# Patient Record
Sex: Female | Born: 1995 | Race: Black or African American | Hispanic: No | Marital: Single | Smoking: Never smoker
Health system: Southern US, Community
[De-identification: ages and names within clinical notes are randomized; demographics above are authoritative.]

## PROBLEM LIST (undated history)

## (undated) DIAGNOSIS — I10 Essential (primary) hypertension: Secondary | ICD-10-CM

## (undated) DIAGNOSIS — T7840XA Allergy, unspecified, initial encounter: Secondary | ICD-10-CM

## (undated) DIAGNOSIS — J449 Chronic obstructive pulmonary disease, unspecified: Secondary | ICD-10-CM

## (undated) DIAGNOSIS — E119 Type 2 diabetes mellitus without complications: Secondary | ICD-10-CM

## (undated) DIAGNOSIS — D571 Sickle-cell disease without crisis: Secondary | ICD-10-CM

## (undated) HISTORY — PX: CHOLECYSTECTOMY: SHX55

## (undated) HISTORY — DX: Allergy, unspecified, initial encounter: T78.40XA

## (undated) HISTORY — PX: MOUTH SURGERY: SHX715

---
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
## 2013-01-02 ENCOUNTER — Encounter (HOSPITAL_COMMUNITY): Payer: Self-pay | Admitting: Emergency Medicine

## 2013-01-02 ENCOUNTER — Emergency Department (HOSPITAL_COMMUNITY): Payer: Medicaid Other

## 2013-01-02 ENCOUNTER — Emergency Department (HOSPITAL_COMMUNITY)
Admission: EM | Admit: 2013-01-02 | Discharge: 2013-01-02 | Disposition: A | Discharge status: Home / Self Care | Payer: Medicaid Other | Attending: Emergency Medicine | Admitting: Emergency Medicine

## 2013-01-02 DIAGNOSIS — M545 Low back pain, unspecified: Secondary | ICD-10-CM | POA: Insufficient documentation

## 2013-01-02 DIAGNOSIS — D5700 Hb-SS disease with crisis, unspecified: Secondary | ICD-10-CM | POA: Insufficient documentation

## 2013-01-02 DIAGNOSIS — Z3202 Encounter for pregnancy test, result negative: Secondary | ICD-10-CM | POA: Insufficient documentation

## 2013-01-02 HISTORY — DX: Sickle-cell disease without crisis: D57.1

## 2013-01-02 LAB — CBC WITH DIFFERENTIAL/PLATELET
Basophils Relative: 0 % (ref 0–1)
Eosinophils Absolute: 0.1 10*3/uL (ref 0.0–0.7)
Eosinophils Relative: 1 % (ref 0–5)
HCT: 24.6 % — ABNORMAL LOW (ref 36.0–46.0)
Hemoglobin: 7.9 g/dL — ABNORMAL LOW (ref 12.0–15.0)
Lymphocytes Relative: 12 % (ref 12–46)
MCHC: 32.1 g/dL (ref 30.0–36.0)
Monocytes Relative: 9 % (ref 3–12)
Neutro Abs: 5.4 10*3/uL (ref 1.7–7.7)
Neutrophils Relative %: 78 % — ABNORMAL HIGH (ref 43–77)
RBC: 3.71 MIL/uL — ABNORMAL LOW (ref 3.87–5.11)

## 2013-01-02 LAB — BASIC METABOLIC PANEL
BUN: 9 mg/dL (ref 6–23)
CO2: 23 mEq/L (ref 19–32)
Chloride: 106 mEq/L (ref 96–112)
GFR calc non Af Amer: >90 mL/min (ref >90)
Glucose, Bld: 92 mg/dL (ref 70–99)
Potassium: 3.3 mEq/L — ABNORMAL LOW (ref 3.5–5.1)
Sodium: 138 mEq/L (ref 135–145)

## 2013-01-02 LAB — URINALYSIS, ROUTINE W REFLEX MICROSCOPIC
Bilirubin Urine: NEGATIVE
Hgb urine dipstick: NEGATIVE
Specific Gravity, Urine: 1.018 (ref 1.005–1.030)
Urobilinogen, UA: 2 mg/dL — ABNORMAL HIGH (ref 0.0–1.0)
pH: 6 (ref 5.0–8.0)

## 2013-01-02 LAB — RETICULOCYTES
RBC.: 3.71 MIL/uL — ABNORMAL LOW (ref 3.87–5.11)
Retic Count, Absolute: 315.4 10*3/uL — ABNORMAL HIGH (ref 19.0–186.0)
Retic Ct Pct: 8.5 % — ABNORMAL HIGH (ref 0.4–3.1)

## 2013-01-02 MED ORDER — HYDROCODONE-ACETAMINOPHEN 5-325 MG PO TABS: 1.0000 | ORAL_TABLET | Freq: Four times a day (QID) | ORAL | Status: DC | PRN

## 2013-01-02 NOTE — ED Provider Notes (Addendum)
History     CSN: [id number]  Arrival date & time 01/02/13  0865   First MD Initiated Contact with Patient 01/02/13 1916      Chief Complaint  Patient presents with  . Sickle Cell Pain Crisis  . Back Pain    (Consider location/radiation/quality/duration/timing/severity/associated sxs/prior treatment) Patient is a 17 y.o. female presenting with sickle cell pain and back pain. The history is provided by the patient.  Sickle Cell Pain Crisis  This is a recurrent problem. The current episode started today (30 min ago). The onset was sudden. The problem occurs continuously. The problem has been rapidly improving. The pain is associated with an unknown factor. The pain is present in the midline region. Site of pain is localized in bone and muscle. The pain is similar to prior episodes. Pain severity now: pain is mild after meds by EMS. Nothing relieves the symptoms. The symptoms are not relieved by ibuprofen. The symptoms are aggravated by activity and movement. Associated symptoms include back pain. Pertinent negatives include no chest pain, no abdominal pain, no nausea, no vomiting, no dysuria, no vaginal bleeding, no vaginal discharge, no joint pain, no neck pain, no neck stiffness, no loss of sensation, no tingling, no weakness, no cough and no difficulty breathing. There is no swelling present. She has been eating and drinking normally. Urine output has been normal. The last void occurred less than 6 hours ago. She sickle cell type is SS. There is no history of acute chest syndrome. There have been no frequent pain crises. She has been treated with chronic transfusion therapy (last blood transfusion was 2 years ago). Recently, medical care has been given by EMS. Services received include medications given ( of fentanyl).  Back Pain Associated symptoms: no abdominal pain, no chest pain, no dysuria, no tingling and no weakness     Past Medical History  Diagnosis Date  . Sickle cell anemia       No past surgical history on file.  History reviewed. No pertinent family history.  History  Substance Use Topics  . Smoking status: Not on file  . Smokeless tobacco: Not on file  . Alcohol Use: No    OB History   Grav Para Term Preterm Abortions TAB SAB Ect Mult Living                  Review of Systems  HENT: Negative for neck pain.   Respiratory: Negative for cough.   Cardiovascular: Negative for chest pain.  Gastrointestinal: Negative for nausea, vomiting and abdominal pain.  Genitourinary: Negative for dysuria, vaginal bleeding and vaginal discharge.  Musculoskeletal: Positive for back pain. Negative for joint pain.  Neurological: Negative for tingling and weakness.  All other systems reviewed and are negative.    Allergies  Vancomycin  Home Medications  No current outpatient prescriptions on file.  BP 126/86  Pulse 84  Temp(Src) 99 F (37.2 C) (Oral)  Resp 20  SpO2 100%  LMP 12/30/2012  Physical Exam  Nursing note and vitals reviewed. Constitutional: She is oriented to person, place, and time. She appears well-developed and well-nourished. No distress.  HENT:  Head: Normocephalic and atraumatic.  Mouth/Throat: Oropharynx is clear and moist.  Eyes: Conjunctivae and EOM are normal. Pupils are equal, round, and reactive to light.  Neck: Normal range of motion. Neck supple.  Cardiovascular: Normal rate, regular rhythm and intact distal pulses.   No murmur heard. Pulmonary/Chest: Effort normal and breath sounds normal. No respiratory distress. She has no  wheezes. She has no rales.  Abdominal: Soft. She exhibits no distension. There is no tenderness. There is no rebound and no guarding.  Musculoskeletal: Normal range of motion. She exhibits no edema and no tenderness.       Lumbar back: She exhibits tenderness, bony tenderness and pain. She exhibits normal range of motion, no deformity and normal pulse.       Back:  Neurological: She is alert and  oriented to person, place, and time. She has normal strength. No sensory deficit.  Normal strength and sensation in the legs  Skin: Skin is warm and dry. No rash noted. No erythema.  Psychiatric: She has a normal mood and affect. Her behavior is normal.    ED Course  Procedures (including critical care time)  Labs Reviewed  CBC WITH DIFFERENTIAL - Abnormal; Notable for the following:    RBC 3.71 (*)    Hemoglobin 7.9 (*)    HCT 24.6 (*)    MCV 66.3 (*)    MCH 21.3 (*)    RDW 21.5 (*)    Neutrophils Relative 78 (*)    All other components within normal limits  BASIC METABOLIC PANEL - Abnormal; Notable for the following:    Potassium 3.3 (*)    All other components within normal limits  URINALYSIS, ROUTINE W REFLEX MICROSCOPIC - Abnormal; Notable for the following:    APPearance CLOUDY (*)    Urobilinogen, UA 2.0 (*)    All other components within normal limits  RETICULOCYTES - Abnormal; Notable for the following:    Retic Ct Pct 8.5 (*)    RBC. 3.71 (*)    Retic Count, Manual 315.4 (*)    All other components within normal limits  POCT PREGNANCY, URINE   Dg Lumbar Spine Complete  01/02/2013  *RADIOLOGY REPORT*  Clinical Data: Low back pain for 30 minutes.  LUMBAR SPINE - COMPLETE 4+ VIEW  Comparison: None.  Findings: Four view exam shows no fracture.  No subluxation. Intervertebral disc spaces are preserved.  The facets are well- aligned bilaterally.  SI joints are normal.  IMPRESSION: Normal exam.   Original Report Authenticated By: Kennith Center, M.D.      1. Sickle cell pain crisis   2. Lumbar back pain       MDM   Patient with a history of sickle cell disease who comes in today complaining of lumbar back pain that started approximately 30 minutes prior to arrival. She states she was laying in bed when the pain started. She denies any heavy lifting, radiation down to her legs, fever or, recent hospitalization for recent illness.  Patient was given fentanyl per EMS on the  way here and when she arrived here her pain was 2/10. She states she's had pain in her back before approximately 3-4 months ago but usually just takes ibuprofen and Benadryl. Her last blood transfusion was 2 years ago and she states she does not usually get pain crises. Most recent menses was last week and she denies any urinary symptoms or vaginal discharge. She has mild L-spine tenderness on exam but otherwise her exam is normal.  Lumbar films are within normal limits. CBC, BMP, UA, UPT, retic count pending  10:01 PM Labs with a hemoglobin of 7.9 and elevation of the reticular. All spine films are negative. Feel this is a sickle cell crisis his urine and urine pregnancy are negative. Patient remains pain free after one dose of IV fentanyl. Will discharge home with pain medication and  followup with Dr. Nedra Hai, MD 01/02/13 5621  Gwyneth Sprout, MD 01/02/13 2204

## 2013-01-02 NOTE — ED Notes (Signed)
Pt here via ems for c/o Sickle Cell pain hurting in her back mostly.Given Fentanyl in route iv. Pain now 5/10

## 2013-01-02 NOTE — ED Notes (Signed)
Asked patient if able to provide a urine sample. Pt stated she is unable to provide at this time as she voided prior to arrival. Pt understanding of need for sample. Will call when able to give sample for lab.

## 2013-01-02 NOTE — ED Notes (Signed)
ZOX:[id number]<AV> Expected date:<BR> Expected time:<BR> Means of arrival:<BR> Comments:<BR> Ems/back

## 2013-03-05 ENCOUNTER — Observation Stay (HOSPITAL_COMMUNITY): Payer: Medicaid Other

## 2013-03-05 ENCOUNTER — Emergency Department (HOSPITAL_COMMUNITY): Payer: Medicaid Other

## 2013-03-05 ENCOUNTER — Encounter (HOSPITAL_COMMUNITY): Payer: Self-pay

## 2013-03-05 ENCOUNTER — Observation Stay (HOSPITAL_COMMUNITY)
Admission: EM | Admit: 2013-03-05 | Discharge: 2013-03-08 | Disposition: A | Discharge status: Home / Self Care | Payer: Medicaid Other | Attending: Internal Medicine | Admitting: Internal Medicine

## 2013-03-05 DIAGNOSIS — E876 Hypokalemia: Secondary | ICD-10-CM | POA: Insufficient documentation

## 2013-03-05 DIAGNOSIS — D649 Anemia, unspecified: Secondary | ICD-10-CM | POA: Insufficient documentation

## 2013-03-05 DIAGNOSIS — Z9089 Acquired absence of other organs: Secondary | ICD-10-CM | POA: Insufficient documentation

## 2013-03-05 DIAGNOSIS — D696 Thrombocytopenia, unspecified: Secondary | ICD-10-CM | POA: Insufficient documentation

## 2013-03-05 DIAGNOSIS — D72829 Elevated white blood cell count, unspecified: Secondary | ICD-10-CM | POA: Insufficient documentation

## 2013-03-05 DIAGNOSIS — D5700 Hb-SS disease with crisis, unspecified: Principal | ICD-10-CM | POA: Insufficient documentation

## 2013-03-05 DIAGNOSIS — IMO0002 Reserved for concepts with insufficient information to code with codable children: Secondary | ICD-10-CM | POA: Insufficient documentation

## 2013-03-05 DIAGNOSIS — D735 Infarction of spleen: Secondary | ICD-10-CM | POA: Diagnosis present

## 2013-03-05 DIAGNOSIS — D7389 Other diseases of spleen: Secondary | ICD-10-CM | POA: Insufficient documentation

## 2013-03-05 LAB — CBC WITH DIFFERENTIAL/PLATELET
Basophils Absolute: 0 10*3/uL (ref 0.0–0.1)
Basophils Relative: 0 % (ref 0–1)
Eosinophils Absolute: 0 10*3/uL (ref 0.0–0.7)
Eosinophils Relative: 0 % (ref 0–5)
HCT: 21.6 % — ABNORMAL LOW (ref 36.0–46.0)
Hemoglobin: 7.1 g/dL — ABNORMAL LOW (ref 12.0–15.0)
Lymphocytes Relative: 6 % — ABNORMAL LOW (ref 12–46)
Lymphs Abs: 0.7 10*3/uL (ref 0.7–4.0)
MCH: 21.2 pg — ABNORMAL LOW (ref 26.0–34.0)
MCHC: 32.9 g/dL (ref 30.0–36.0)
MCV: 64.5 fL — ABNORMAL LOW (ref 78.0–100.0)
Monocytes Absolute: 0.6 10*3/uL (ref 0.1–1.0)
Monocytes Relative: 5 % (ref 3–12)
Neutro Abs: 10.8 10*3/uL — ABNORMAL HIGH (ref 1.7–7.7)
Neutrophils Relative %: 89 % — ABNORMAL HIGH (ref 43–77)
Platelets: 90 10*3/uL — ABNORMAL LOW (ref 150–400)
RBC: 3.35 MIL/uL — ABNORMAL LOW (ref 3.87–5.11)
RDW: 21.7 % — ABNORMAL HIGH (ref 11.5–15.5)
WBC: 12.1 10*3/uL — ABNORMAL HIGH (ref 4.0–10.5)

## 2013-03-05 LAB — PREGNANCY, URINE: Preg Test, Ur: NEGATIVE

## 2013-03-05 LAB — URINALYSIS, MICROSCOPIC ONLY
Nitrite: NEGATIVE
Specific Gravity, Urine: 1.017 (ref 1.005–1.030)
pH: 6.5 (ref 5.0–8.0)

## 2013-03-05 LAB — COMPREHENSIVE METABOLIC PANEL
ALT: 21 U/L (ref 0–35)
BUN: 10 mg/dL (ref 6–23)
Calcium: 10.3 mg/dL (ref 8.4–10.5)
GFR calc Af Amer: >90 mL/min (ref >90)
Glucose, Bld: 89 mg/dL (ref 70–99)
Sodium: 137 mEq/L (ref 135–145)
Total Protein: 7.5 g/dL (ref 6.0–8.3)

## 2013-03-05 LAB — MAGNESIUM: Magnesium: 1.7 mg/dL (ref 1.5–2.5)

## 2013-03-05 MED ORDER — SODIUM CHLORIDE 0.45 % IV SOLN
INTRAVENOUS | Status: DC
  Administered 2013-03-05 – 2013-03-07 (×4): via INTRAVENOUS

## 2013-03-05 MED ORDER — ACETAMINOPHEN 325 MG PO TABS
650.0000 mg | ORAL_TABLET | Freq: Four times a day (QID) | ORAL | Status: DC | PRN
  Administered 2013-03-06: 650 mg via ORAL
  Filled 2013-03-05: qty 2

## 2013-03-05 MED ORDER — HYDROMORPHONE HCL PF 1 MG/ML IJ SOLN
1.0000 mg | Freq: Once | INTRAMUSCULAR | Status: AC
  Administered 2013-03-05: 1 mg via INTRAVENOUS
  Filled 2013-03-05: qty 1

## 2013-03-05 MED ORDER — FOLIC ACID 1 MG PO TABS
1.0000 mg | ORAL_TABLET | Freq: Every day | ORAL | Status: DC
  Administered 2013-03-06: 1 mg via ORAL
  Filled 2013-03-05 (×4): qty 1

## 2013-03-05 MED ORDER — ACETAMINOPHEN 650 MG RE SUPP: 650.0000 mg | Freq: Four times a day (QID) | RECTAL | Status: DC | PRN

## 2013-03-05 MED ORDER — OXYCODONE HCL 5 MG PO TABS: 10.0000 mg | ORAL_TABLET | ORAL | Status: DC | PRN

## 2013-03-05 MED ORDER — SODIUM CHLORIDE 0.9 % IV BOLUS (SEPSIS)
1000.0000 mL | Freq: Once | INTRAVENOUS | Status: AC
  Administered 2013-03-05: 1000 mL via INTRAVENOUS

## 2013-03-05 MED ORDER — POTASSIUM CHLORIDE CRYS ER 20 MEQ PO TBCR
40.0000 meq | EXTENDED_RELEASE_TABLET | Freq: Two times a day (BID) | ORAL | Status: AC
  Administered 2013-03-05 – 2013-03-06 (×2): 40 meq via ORAL
  Filled 2013-03-05 (×2): qty 2

## 2013-03-05 MED ORDER — ONDANSETRON HCL 4 MG PO TABS: 4.0000 mg | ORAL_TABLET | Freq: Four times a day (QID) | ORAL | Status: DC | PRN

## 2013-03-05 MED ORDER — ONDANSETRON HCL 4 MG/2ML IJ SOLN: 4.0000 mg | Freq: Four times a day (QID) | INTRAMUSCULAR | Status: DC | PRN

## 2013-03-05 MED ORDER — MORPHINE SULFATE 2 MG/ML IJ SOLN
1.0000 mg | INTRAMUSCULAR | Status: DC | PRN
  Administered 2013-03-05 – 2013-03-06 (×3): 1 mg via INTRAVENOUS
  Filled 2013-03-05 (×3): qty 1

## 2013-03-05 MED ORDER — SENNOSIDES-DOCUSATE SODIUM 8.6-50 MG PO TABS
1.0000 | ORAL_TABLET | Freq: Every day | ORAL | Status: DC | PRN
  Filled 2013-03-05: qty 1

## 2013-03-05 MED ORDER — ENOXAPARIN SODIUM 40 MG/0.4ML ~~LOC~~ SOLN
40.0000 mg | SUBCUTANEOUS | Status: DC
Start: 2013-03-05 — End: 2013-03-07
  Filled 2013-03-05 (×3): qty 0.4

## 2013-03-05 NOTE — H&P (Addendum)
[facility] History and Physical  Ana Brooks ZOX:[id number] DOB: 09-03-1996 DOA: 03/05/2013  Referring physician: Dr Ranae Palms PCP: August Saucer ERIC, MD    Chief Complaint: left sided abdominal pain.   HPI: Ana Brooks is a 17 y.o. female with h/o sickle cell anemia, on ibuprofen at home, s/o cholecystectomy, came in for left sided abdominal pain, not associated with nausea, vomiting. On arrival to ED, she was found to have hgb of 7.1 drop from baseline of 7.8, hypokalemic and with mild leukocytosis. [place] abd revealed diffuse intrahepatic biliary prominence without CBD dilatation. And mild splenomegaly. Her symptoms improved after IV pain medication and fluids . But she is scared to go home for recurrence of pain. We will admit her for observation and manage her pain with fluids, anti emetics and paincontrol.   Review of Systems: The patient denies anorexia, fever, weight loss,, vision loss, decreased hearing, hoarseness, chest pain, syncope, dyspnea on exertion, peripheral edema, balance deficits, hemoptysis,  melena, hematochezia, severe indigestion/heartburn, hematuria, incontinence, genital sores, muscle weakness, suspicious skin lesions, transient blindness, difficulty walking, depression, unusual weight change, abnormal bleeding, enlarged lymph nodes, angioedema, and breast masses.    Past Medical History  Diagnosis Date  . Sickle cell anemia    Past Surgical History  Procedure Laterality Date  . Mouth surgery     Social History:  reports that she has never smoked. She does not have any smokeless tobacco history on file. She reports that she does not drink alcohol or use illicit drugs.  where does patient live--home,   Allergies  Allergen Reactions  . Vancomycin Hives    No family history on file.   Prior to Admission medications   Not on File   Physical Exam: Filed Vitals:   03/05/13 1003  BP: 127/61  Pulse: 94  Temp: 98.3 F (36.8 C)  TempSrc: Oral   Resp: 18  SpO2: 100%    Constitutional: Vital signs reviewed.  Patient is a well-developed and well-nourished  in no acute distress and cooperative with exam. Alert and oriented x3.  Head: Normocephalic and atraumatic Mouth: no erythema or exudates, MMM Eyes: PERRL, EOMI, conjunctivae normal, No scleral icterus.  Neck: Supple, Trachea midline normal ROM, No JVD, mass, thyromegaly, or carotid bruit present.  Cardiovascular: RRR, S1 normal, S2 normal, no MRG, pulses symmetric and intact bilaterally Pulmonary/Chest: CTAB, no wheezes, rales, or rhonchi Abdominal: Soft. Tender in the left upper quadrant, non-distended, bowel sounds are normal, voluntary quarding present and no signs of peritonitis.  Musculoskeletal: No joint deformities, erythema, or stiffness, ROM full and no nontender Neurological: A&O x3, Strength is normal and symmetric bilaterally, cranial nerve II-XII are grossly intact, no focal motor deficit, sensory intact to light touch bilaterally.  Skin: Warm, dry and intact. No rash, cyanosis, or clubbing.  Psychiatric: Normal mood and affect. speech and behavior is normal.  Labs on Admission:  Basic Metabolic Panel:  Recent Labs Lab 03/05/13 1033  NA 137  K 3.3*  CL 104  CO2 21  GLUCOSE 89  BUN 10  CREATININE 0.62  CALCIUM 10.3   Liver Function Tests:  Recent Labs Lab 03/05/13 1033  AST 63*  ALT 21  ALKPHOS 48  BILITOT 3.7*  PROT 7.5  ALBUMIN 4.7    Recent Labs Lab 03/05/13 1033  LIPASE 18   No results found for this basename: AMMONIA,  in the last 168 hours CBC:  Recent Labs Lab 03/05/13 1033  WBC 12.1*  NEUTROABS 10.8*  HGB 7.1*  HCT 21.6*  MCV 64.5*  PLT 90*   Cardiac Enzymes: No results found for this basename: CKTOTAL, CKMB, CKMBINDEX, TROPONINI,  in the last 168 hours  BNP (last 3 results) No results found for this basename: PROBNP,  in the last 8760 hours CBG: No results found for this basename: GLUCAP,  in the last 168  hours  Radiological Exams on Admission: US Abdomen Complete  03/05/2013  *RADIOLOGY REPORT*  Clinical Data:  Left upper quadrant pain, six cell anemia, history of cholecystectomy  ABDOMINAL ULTRASOUND COMPLETE  Comparison:  None.  Findings:  Gallbladder:  Surgically absent  Common Bile Duct:  Within normal limits in caliber.  Liver: No focal mass lesion identified.  Within normal limits in parenchymal echogenicity.Mild diffuse intrahepatic biliary prominence without CBD dilatation.  This is nonspecific but can be seen after cholecystectomy.  Recommend correlation with LFTs and bilirubin.  IVC:  Appears normal.  Pancreas:  No abnormality identified.  Spleen:  Homogeneous echotexture but slightly enlarged.  13 cm length.  No focal abnormality.  Right kidney:  Normal in size and parenchymal echogenicity.  No evidence of mass or hydronephrosis.  Left kidney:  Normal in size and parenchymal echogenicity.  No evidence of mass or hydronephrosis.  Abdominal Aorta:  No aneurysm identified.  IMPRESSION: Diffuse intrahepatic biliary prominence.  No definite biliary obstruction. Normalcommon bile duct diameter measuring 4.6 mm.  Prior cholecystectomy.  Mild splenomegaly.  No acute finding by ultrasound.   Original Report Authenticated By: Judie Petit. Shick, M.D.     EKG: pending.  Assessment/Plan Active Problems:   Sickle cell crisis   Anemia   Leukocytosis   Hypokalemia   1. Abdominal pain possibly from Sickle cell Painful crisis/ splenic sequestration:  Admit for observation - IV fluids, IV pain control with morphine and oxy IR. And anti emetics.   2. Hypokalemia  - will be repleted as needed.  - will obtain mag levels.   3. Leukocytosis:  - possibly reactive, vs nucleated rbc's.  - UA negative. CXR pending. [place] abd shows mild splenomegaly.   4.anemia: microcytic. Anemia panel will be ordered. Folic acid ordered.    DVT prophylaxis.  Sickle cell service will take over the care of the patient in  am. Discussed with Dr Joneen Roach.  Code Status: presumed full code Family Communication: none atbedside Disposition Plan: possibly home when pain is controlled.   Time spent: 70 minutes.   [facility] Pager [phone]  If 7PM-7AM, please contact night-coverage [email or web address] Password [email or web address] 03/05/2013, 4:11 PM

## 2013-03-05 NOTE — ED Notes (Signed)
Patient in Ultrasound.

## 2013-03-05 NOTE — ED Notes (Signed)
Pt states she has RUQ pain.  Denies n/v/d.  Pt states pain started last night.  Describes pain as sharp

## 2013-03-05 NOTE — ED Provider Notes (Signed)
History     CSN: [id number]  Arrival date & time 03/05/13  4696   First MD Initiated Contact with Patient 03/05/13 1020      Chief Complaint  Patient presents with  . Abdominal Pain    (Consider location/radiation/quality/duration/timing/severity/associated sxs/prior treatment) HPI Pt is an 17yo female with hx of sickle cell c/o LUQ pain that is constant and sharp in nature 10/10 that started last night. Pain radiates to left flank.  Nothing makes the pain better or worse.  Pt usually takes hydrocodone for pain but has not had any since pain started suddenly last night.  Pt reports feeling well up until pain started. Denies fever, n/v/d, dysuria or vaginal discharge.  LMP 3/13. Was normal.  PCP Dr. August Saucer, routine appointment was scheduled for tomorrow 5/5.   Past Medical History  Diagnosis Date  . Sickle cell anemia     Past Surgical History  Procedure Laterality Date  . Mouth surgery      No family history on file.  History  Substance Use Topics  . Smoking status: Never Smoker   . Smokeless tobacco: Not on file  . Alcohol Use: No    OB History   Grav Para Term Preterm Abortions TAB SAB Ect Mult Living                  Review of Systems  Constitutional: Negative for fever and chills.  Respiratory: Negative for cough.   Cardiovascular: Negative for chest pain.  Gastrointestinal: Positive for abdominal pain.  Genitourinary: Negative for dysuria.  All other systems reviewed and are negative.    Allergies  Vancomycin  Home Medications  No current outpatient prescriptions on file.  BP 127/61  Pulse 94  Temp(Src) 98.3 F (36.8 C) (Oral)  Resp 18  SpO2 100%  Physical Exam  Nursing note and vitals reviewed. Constitutional: She appears well-developed and well-nourished. She appears distressed ( mild).  Pt lying on exam bed with hands over LUQ  HENT:  Head: Normocephalic and atraumatic.  Eyes: Conjunctivae are normal. No scleral icterus.  Neck: Normal  range of motion.  Cardiovascular: Normal rate, regular rhythm and normal heart sounds.   Pulmonary/Chest: Effort normal and breath sounds normal. No respiratory distress. She has no wheezes. She has no rales. She exhibits no tenderness.  Abdominal: Soft. Bowel sounds are normal. She exhibits no distension and no mass. There is tenderness ( diffuse, greater in LUQ). There is guarding ( LUQ). There is no rebound.  Musculoskeletal: Normal range of motion.  Neurological: She is alert.  Skin: Skin is warm and dry. She is not diaphoretic.    ED Course  Procedures (including critical care time)  Labs Reviewed  CBC WITH DIFFERENTIAL - Abnormal; Notable for the following:    WBC 12.1 (*)    RBC 3.35 (*)    Hemoglobin 7.1 (*)    HCT 21.6 (*)    MCV 64.5 (*)    MCH 21.2 (*)    RDW 21.7 (*)    Platelets 90 (*)    Neutrophils Relative 89 (*)    Lymphocytes Relative 6 (*)    Neutro Abs 10.8 (*)    All other components within normal limits  COMPREHENSIVE METABOLIC PANEL - Abnormal; Notable for the following:    Potassium 3.3 (*)    AST 63 (*)    Total Bilirubin 3.7 (*)    All other components within normal limits  URINALYSIS, MICROSCOPIC ONLY - Abnormal; Notable for the following:  APPearance CLOUDY (*)    Ketones, ur 40 (*)    Urobilinogen, UA 2.0 (*)    Leukocytes, UA SMALL (*)    Squamous Epithelial / LPF FEW (*)    All other components within normal limits  RETICULOCYTES - Abnormal; Notable for the following:    Retic Ct Pct 11.5 (*)    RBC. 3.37 (*)    Retic Count, Manual 387.6 (*)    All other components within normal limits  LIPASE, BLOOD  PREGNANCY, URINE   US Abdomen Complete  03/05/2013  *RADIOLOGY REPORT*  Clinical Data:  Left upper quadrant pain, six cell anemia, history of cholecystectomy  ABDOMINAL ULTRASOUND COMPLETE  Comparison:  None.  Findings:  Gallbladder:  Surgically absent  Common Bile Duct:  Within normal limits in caliber.  Liver: No focal mass lesion  identified.  Within normal limits in parenchymal echogenicity.Mild diffuse intrahepatic biliary prominence without CBD dilatation.  This is nonspecific but can be seen after cholecystectomy.  Recommend correlation with LFTs and bilirubin.  IVC:  Appears normal.  Pancreas:  No abnormality identified.  Spleen:  Homogeneous echotexture but slightly enlarged.  13 cm length.  No focal abnormality.  Right kidney:  Normal in size and parenchymal echogenicity.  No evidence of mass or hydronephrosis.  Left kidney:  Normal in size and parenchymal echogenicity.  No evidence of mass or hydronephrosis.  Abdominal Aorta:  No aneurysm identified.  IMPRESSION: Diffuse intrahepatic biliary prominence.  No definite biliary obstruction. Normalcommon bile duct diameter measuring 4.6 mm.  Prior cholecystectomy.  Mild splenomegaly.  No acute finding by ultrasound.   Original Report Authenticated By: Judie Petit. Shick, M.D.      1. Sickle cell crisis       MDM  Pt with hx of sickle cell presenting with LUQ pain.  Reports having spleenomegaly.  Last seen for crisis on 3/3 of this yr.  Was tx for pain and discharged home.  Pt states she does not normally get sickle cell crises.  Cannot remember last time she needed transfusion.  Does not know what her baseline Hgb is.   Started fluids and diluadid.  Will obtain cbc, bmp, abd u/s, and urine preg.  Pain improved with fluids and pain medicine.    Retic: 11.5% up from 8.5% two mo ago Hgb 7.1 down from 7.9 two mo ago. Hct 21.6 down from 24.6 two mo ago.  CMP: mild hypokalemia 3.3  Discussed with Dr. Ranae Palms who consulted Dr. Blake Divine who came to evaluate pt.  Would like to admit for observation.        Junius Finner, PA-C 03/05/13 1623

## 2013-03-06 ENCOUNTER — Encounter (HOSPITAL_COMMUNITY): Payer: Self-pay | Admitting: Radiology

## 2013-03-06 ENCOUNTER — Observation Stay (HOSPITAL_COMMUNITY): Payer: Medicaid Other

## 2013-03-06 DIAGNOSIS — D735 Infarction of spleen: Secondary | ICD-10-CM | POA: Diagnosis present

## 2013-03-06 LAB — IRON AND TIBC
Iron: 24 ug/dL — ABNORMAL LOW (ref 42–135)
TIBC: 227 ug/dL — ABNORMAL LOW (ref 250–470)
UIBC: 203 ug/dL (ref 125–400)

## 2013-03-06 LAB — FERRITIN: Ferritin: 456 ng/mL — ABNORMAL HIGH (ref 10–291)

## 2013-03-06 MED ORDER — IOHEXOL 300 MG/ML  SOLN
100.0000 mL | Freq: Once | INTRAMUSCULAR | Status: AC | PRN
  Administered 2013-03-06: 100 mL via INTRAVENOUS

## 2013-03-06 MED ORDER — IOHEXOL 300 MG/ML  SOLN
25.0000 mL | Freq: Once | INTRAMUSCULAR | Status: AC | PRN
  Administered 2013-03-06: 25 mL via ORAL

## 2013-03-06 MED ORDER — OXYCODONE HCL 5 MG PO TABS
10.0000 mg | ORAL_TABLET | ORAL | Status: DC
Start: 2013-03-06 — End: 2013-03-08
  Administered 2013-03-06 – 2013-03-08 (×9): 10 mg via ORAL
  Filled 2013-03-06 (×2): qty 2
  Filled 2013-03-06: qty 1
  Filled 2013-03-06 (×3): qty 2
  Filled 2013-03-06: qty 1
  Filled 2013-03-06 (×5): qty 2
  Filled 2013-03-06: qty 1

## 2013-03-06 MED ORDER — IOHEXOL 300 MG/ML  SOLN: 25.0000 mL | INTRAMUSCULAR | Status: DC

## 2013-03-06 MED ORDER — IBUPROFEN 400 MG PO TABS
400.0000 mg | ORAL_TABLET | Freq: Once | ORAL | Status: AC
  Administered 2013-03-06: 400 mg via ORAL
  Filled 2013-03-06: qty 1

## 2013-03-06 NOTE — Progress Notes (Signed)
Chaplain Note: Found pt sitting on side of bed. Introduced myself and told pt about services chaplains offer. Pt quiet and soft spoken...very little to say. Social, spiritual and emotional support offered.  Will follow up as needed.  Rutherford Nail Chaplain Resident [phone]

## 2013-03-06 NOTE — Care Management (Addendum)
Case Management Clinical Tool  Patient: Ana Brooks   MRN: [id number] Date Initiated: 03/06/2013            Documentation initiated by: Ana Caldwell RN, BSN, BS  Subjective/Objective Assessment:  17 year old female admitted for [place] pain crisis, for observation( hemiglobinopathy results pending). Patient states she has attempt to work however unable to get hired for a job, patient and Ana Brooks are concerned about patient getting disability. Patient states she was previously denied for disabliity. Patient had an appointment scheduled with Dr. Ashley Royalty at [facility] for today, which will be rescheduled to establish care.  Barriers to care: Will continue to monitor.  Prior Approval(PA) #: NA                                   PA start date:   NA                PA end date: NA   Action/Plan:  This CM called Ana Brooks (patient's case worker with [employer]) and awaiting call back to obtain status. (Received call from Ana Brooks CM with [facility], who advised that patient had not made any scheduled appointment with [facility] agency however CM did instruct patient on how to complete disability application, and no other action have been done by the agency at this time.)       Comments: Patient does not have any other concerns at this time. Contact information given to patient for this CM.This CM spoke with Ana Brooks CM for [facility] who stated   Ana Caldwell,  RN, BSN, [place] [phone]

## 2013-03-06 NOTE — Care Management (Signed)
CARE MANAGEMENT NOTE 03/06/2013  Patient:  Ana Brooks,Ana Brooks   Account Number:  [id number]  Date Initiated:  03/06/2013  Documentation initiated by:  Eulalio Reamy  Subjective/Objective Assessment:   17 yo female admitted with sickle cell crisis. PCP Dr.Matthews     Action/Plan:   Home when stable   Anticipated DC Date:     Anticipated DC Plan:  HOME/SELF CARE      DC Planning Services  CM consult      Choice offered to / List presented to:             Status of service:  In process, will continue to follow Medicare Important Message given?   (If response is "NO", the following Medicare IM given date fields will be blank) Date Medicare IM given:   Date Additional Medicare IM given:    Discharge Disposition:    Per UR Regulation:  Reviewed for med. necessity/level of care/duration of stay  If discussed at Long Length of Stay Meetings, dates discussed:    Comments:  03/06/13 1328 Jessey Huyett,RN,BSN [phone] No needs assessed at this time. [facility] Cm followiing for SSi benefits check & liason with [place] [facility]

## 2013-03-06 NOTE — Progress Notes (Signed)
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 Continue supportive care. Pt reports pain as mild. Will resume Hydrocodone and IV for breakthrough pain. Monitor Hb. Genotype HbSS verified by electrophoresis from [facility] records.    Anemia: Pt has had only a small drop in Hb. Will recheck Hb tomorrow. If stable she can likely go home.    Leukocytosis: Mild leukocytosis associated with VOC and splenic infarct.    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 Supportive care.   Code Status: Full code Family Communication: Updated Father Disposition Plan: Home in 24 to 48 hours as long as Hb stable.  MATTHEWS,MICHELLE A.  Pager [phone]. If 7PM-7AM, please contact night-coverage.  03/06/2013, 6:35 PM  LOS: 1 day   Brief narrative: Ana Brooks is a 17 y.o. female with h/o sickle cell anemia, on ibuprofen at home, s/o cholecystectomy, came in for left sided abdominal pain, not associated with nausea, vomiting. On arrival to ED, she was found to have hgb of 7.1 drop from baseline of 7.8, hypokalemic and with mild leukocytosis. [place] abd revealed diffuse intrahepatic biliary prominence without CBD dilatation. And mild splenomegaly. Her symptoms improved after IV pain medication and fluids . But she is scared to go home for recurrence of pain. We will admit her for observation and manage her pain with fluids, anti emetics and paincontrol.    Consultants:  None  Procedures:  None  Antibiotics:  None  HPI/Subjective: Pt states that pain is mild and localized only to her LUQ. Eating well. BM yesterday.  Objective: Filed Vitals:   03/06/13 0212 03/06/13 0551 03/06/13 1010 03/06/13 1357  BP: 124/53 123/54 125/59 126/67   Pulse: 103 94 97 96  Temp: 103 F (39.4 C) 101.2 F (38.4 C) 100.2 F (37.9 C) 98.9 F (37.2 C)  TempSrc: Oral Oral Oral Oral  Resp: 20 20 18 16   Height:      Weight:  79.3 kg (174 lb 13.2 oz)    SpO2: 98% 97% 98% 99%   Weight change:  No intake or output data in the 24 hours ending 03/06/13 1835  General: Alert, awake, oriented x3, in no acute distress.  HEENT: [place]/AT PEERL, EOMI Heart: Regular rate and rhythm, without murmurs, rubs, gallops.  Lungs: Clear to auscultation, no wheezing or rhonchi noted.  Abdomen: Soft,LUQ tenderness, nondistended, positive bowel sounds, no masses mild splenomegaly noted.  Neuro: No focal neurological deficits noted cranial nerves II through XII grossly intact. Strength functional in bilateral upper and lower extremities. Musculoskeletal: No warm swelling or erythema around joints, no spinal tenderness noted. Psychiatric: Patient alert and oriented x3, good insight and cognition, good recent to remote recall.   Data Reviewed: Basic Metabolic Panel:  Recent Labs Lab 03/05/13 1033 03/05/13 1959  NA 137  --   K 3.3*  --   CL 104  --   CO2 21  --   GLUCOSE 89  --   BUN 10  --   CREATININE 0.62  --   CALCIUM 10.3  --   MG  --  1.7   Liver Function Tests:  Recent Labs Lab 03/05/13 1033  AST 63*  ALT 21  ALKPHOS 48  BILITOT 3.7*  PROT 7.5  ALBUMIN 4.7    Recent Labs  Lab 03/05/13 1033  LIPASE 18   No results found for this basename: AMMONIA,  in the last 168 hours CBC:  Recent Labs Lab 03/05/13 1033  WBC 12.1*  NEUTROABS 10.8*  HGB 7.1*  HCT 21.6*  MCV 64.5*  PLT 90*   Cardiac Enzymes: No results found for this basename: CKTOTAL, CKMB, CKMBINDEX, TROPONINI,  in the last 168 hours BNP (last 3 results) No results found for this basename: PROBNP,  in the last 8760 hours CBG: No results found for this basename: GLUCAP,  in the last 168 hours  No results found for this or any previous visit (from the past 240  hour(s)).   Studies: Dg Chest 2 View  03/05/2013  *RADIOLOGY REPORT*  Clinical Data: Lower chest pain, sickle cell  CHEST - 2 VIEW  None  Findings: The cardiomediastinal silhouette is unremarkable.  No acute infiltrate or pleural effusion.  No pulmonary edema.  Bony thorax is unremarkable.  IMPRESSION: No active disease.   Original Report Authenticated By: Natasha Mead, M.D.    US Abdomen Complete  03/05/2013  *RADIOLOGY REPORT*  Clinical Data:  Left upper quadrant pain, six cell anemia, history of cholecystectomy  ABDOMINAL ULTRASOUND COMPLETE  Comparison:  None.  Findings:  Gallbladder:  Surgically absent  Common Bile Duct:  Within normal limits in caliber.  Liver: No focal mass lesion identified.  Within normal limits in parenchymal echogenicity.Mild diffuse intrahepatic biliary prominence without CBD dilatation.  This is nonspecific but can be seen after cholecystectomy.  Recommend correlation with LFTs and bilirubin.  IVC:  Appears normal.  Pancreas:  No abnormality identified.  Spleen:  Homogeneous echotexture but slightly enlarged.  13 cm length.  No focal abnormality.  Right kidney:  Normal in size and parenchymal echogenicity.  No evidence of mass or hydronephrosis.  Left kidney:  Normal in size and parenchymal echogenicity.  No evidence of mass or hydronephrosis.  Abdominal Aorta:  No aneurysm identified.  IMPRESSION: Diffuse intrahepatic biliary prominence.  No definite biliary obstruction. Normalcommon bile duct diameter measuring 4.6 mm.  Prior cholecystectomy.  Mild splenomegaly.  No acute finding by ultrasound.   Original Report Authenticated By: Judie Petit. Miles Costain, M.D.    Ct Abdomen Pelvis W Contrast  03/06/2013  *RADIOLOGY REPORT*  Clinical Data: Sickle cell crisis.  Leukocytosis.  Left lower quadrant pain and fever.  CT ABDOMEN AND PELVIS WITH CONTRAST  Technique:  Multidetector CT imaging of the abdomen and pelvis was performed following the standard protocol during bolus administration of intravenous  contrast.  Contrast: OMNIPAQUE IOHEXOL 300 MG/ML  SOLN  Comparison: None.  Findings: No focal abnormalities seen in the liver.  Liver size normal at 14.5 cm in cranial caudal length.  Spleen is enlarged at 16.6 cm in cranial caudal length.  No focal abnormalities seen in the spleen although there are subtle patchy areas of decreased subcapsular perfusion in the mid and upper spleen.  The stomach, duodenum, pancreas, and adrenal glands are unremarkable.  Gallbladder is surgically absent.  Right kidney is unremarkable.  The splenic enlargement generates mass effect on the left kidney.  No abdominal aortic aneurysm.  No free fluid or lymphadenopathy in the abdomen.  The abdominal bowel loops have normal imaging features.  No pelvic sidewall lymphadenopathy. Small amount of intraperitoneal free fluid is identified and can be physiologic in a premenopausal female.  Bladder is unremarkable.  Uterus is normal in appearance. No adnexal mass.  Terminal ileum is normal.  The appendix is normal.  Bone windows reveal  no worrisome lytic or sclerotic osseous lesions.  IMPRESSION: Splenomegaly with patchy subtle areas of decreased perfusion in the subcapsular spleen.  Areas of splenic infarction could have this appearance.  Small amount of ascites.   Original Report Authenticated By: Kennith Center, M.D.     Scheduled Meds: . enoxaparin (LOVENOX) injection  40 mg Subcutaneous Q24H  . folic acid  1 mg Oral Daily  . oxyCODONE  10 mg Oral Q4H   Continuous Infusions: . sodium chloride 125 mL/hr at 03/06/13 1115    Time spent 45 minutes.

## 2013-03-07 LAB — CBC WITH DIFFERENTIAL/PLATELET
Basophils Absolute: 0 10*3/uL (ref 0.0–0.1)
Basophils Relative: 0 % (ref 0–1)
Eosinophils Absolute: 0 10*3/uL (ref 0.0–0.7)
HCT: 20.9 % — ABNORMAL LOW (ref 36.0–46.0)
Hemoglobin: 6.5 g/dL — CL (ref 12.0–15.0)
Lymphocytes Relative: 18 % (ref 12–46)
Lymphs Abs: 1.7 10*3/uL (ref 0.7–4.0)
MCH: 20.4 pg — ABNORMAL LOW (ref 26.0–34.0)
MCHC: 31.1 g/dL (ref 30.0–36.0)
MCV: 65.7 fL — ABNORMAL LOW (ref 78.0–100.0)
Neutro Abs: 6.9 10*3/uL (ref 1.7–7.7)
RDW: 20.4 % — ABNORMAL HIGH (ref 11.5–15.5)

## 2013-03-07 LAB — COMPREHENSIVE METABOLIC PANEL
ALT: 12 U/L (ref 0–35)
AST: 23 U/L (ref 0–37)
Alkaline Phosphatase: 40 U/L (ref 39–117)
CO2: 23 mEq/L (ref 19–32)
Calcium: 9.3 mg/dL (ref 8.4–10.5)
GFR calc non Af Amer: >90 mL/min (ref >90)
Potassium: 3.2 mEq/L — ABNORMAL LOW (ref 3.5–5.1)
Sodium: 132 mEq/L — ABNORMAL LOW (ref 135–145)
Total Protein: 6.7 g/dL (ref 6.0–8.3)

## 2013-03-07 LAB — HEMOGLOBIN AND HEMATOCRIT, BLOOD
HCT: 21.1 % — ABNORMAL LOW (ref 36.0–46.0)
Hemoglobin: 6.7 g/dL — CL (ref 12.0–15.0)
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
## 2013-03-07 NOTE — ED Provider Notes (Signed)
Medical screening examination/treatment/procedure(s) were conducted as a shared visit with non-physician practitioner(s) and myself.  I personally evaluated the patient during the encounter   Loren Racer, MD 03/07/13 [phone]
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
## 2013-03-07 NOTE — Progress Notes (Signed)
CRITICAL VALUE ALERT  Critical value received:  Hemoglobin:6.5  Date of notification:  03/07/2013   Time of notification:  0930  Critical value read back:yes  Nurse who received alert:  Farley Ly  MD notified (1st page):  Dr. Ashley Royalty  Time of first page:  0930  MD notified (2nd page):  Time of second page:  Responding MD: Ashley Royalty  Time MD responded:
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
## 2013-03-08 LAB — COMPREHENSIVE METABOLIC PANEL
BUN: 3 mg/dL — ABNORMAL LOW (ref 6–23)
CO2: 25 mEq/L (ref 19–32)
Calcium: 9.9 mg/dL (ref 8.4–10.5)
Chloride: 104 mEq/L (ref 96–112)
Creatinine, Ser: 0.57 mg/dL (ref 0.50–1.10)
GFR calc non Af Amer: >90 mL/min (ref >90)
Total Bilirubin: 2.6 mg/dL — ABNORMAL HIGH (ref 0.3–1.2)

## 2013-03-08 LAB — CBC
Platelets: 100 10*3/uL — ABNORMAL LOW (ref 150–400)
RBC: 3.37 MIL/uL — ABNORMAL LOW (ref 3.87–5.11)
WBC: 6.8 10*3/uL (ref 4.0–10.5)

## 2013-03-08 LAB — HEMOGLOBINOPATHY EVALUATION
Hgb A2 Quant: 4.8 % — ABNORMAL HIGH (ref 2.2–3.2)
Hgb F Quant: 23.1 % — ABNORMAL HIGH (ref 0.0–2.0)

## 2013-03-08 MED ORDER — FOLIC ACID 1 MG PO TABS: 1.0000 mg | ORAL_TABLET | Freq: Every day | ORAL | Status: DC

## 2013-03-08 MED ORDER — SENNOSIDES-DOCUSATE SODIUM 8.6-50 MG PO TABS: 1.0000 | ORAL_TABLET | Freq: Every day | ORAL | Status: DC | PRN

## 2013-03-08 MED ORDER — OXYCODONE HCL 10 MG PO TABS: 10.0000 mg | ORAL_TABLET | ORAL | Status: DC

## 2013-03-08 NOTE — Progress Notes (Signed)
Pt. Was discharged home. Discharge instructions and prescriptions were given to the pt. She transported home by her sister.
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
## 2013-03-12 LAB — CULTURE, BLOOD (ROUTINE X 2)
Culture: NO GROWTH
Culture: NO GROWTH

## 2013-03-17 ENCOUNTER — Encounter: Payer: Self-pay | Admitting: Internal Medicine

## 2013-06-08 ENCOUNTER — Emergency Department (HOSPITAL_COMMUNITY)
Admission: EM | Admit: 2013-06-08 | Discharge: 2013-06-08 | Disposition: A | Discharge status: Home / Self Care | Payer: Self-pay | Attending: Emergency Medicine | Admitting: Emergency Medicine

## 2013-06-08 ENCOUNTER — Encounter (HOSPITAL_COMMUNITY): Payer: Self-pay | Admitting: *Deleted

## 2013-06-08 DIAGNOSIS — Z79899 Other long term (current) drug therapy: Secondary | ICD-10-CM | POA: Insufficient documentation

## 2013-06-08 DIAGNOSIS — Z7982 Long term (current) use of aspirin: Secondary | ICD-10-CM | POA: Insufficient documentation

## 2013-06-08 DIAGNOSIS — M25569 Pain in unspecified knee: Secondary | ICD-10-CM | POA: Insufficient documentation

## 2013-06-08 DIAGNOSIS — D5700 Hb-SS disease with crisis, unspecified: Secondary | ICD-10-CM | POA: Insufficient documentation

## 2013-06-08 DIAGNOSIS — R000 Tachycardia, unspecified: Secondary | ICD-10-CM | POA: Insufficient documentation

## 2013-06-08 LAB — CBC WITH DIFFERENTIAL/PLATELET
Basophils Absolute: 0 10*3/uL (ref 0.0–0.1)
Eosinophils Absolute: 0 10*3/uL (ref 0.0–0.7)
HCT: 27.3 % — ABNORMAL LOW (ref 36.0–46.0)
Lymphocytes Relative: 13 % (ref 12–46)
MCHC: 32.2 g/dL (ref 30.0–36.0)
Monocytes Relative: 8 % (ref 3–12)
Neutro Abs: 6.3 10*3/uL (ref 1.7–7.7)
Neutrophils Relative %: 79 % — ABNORMAL HIGH (ref 43–77)
Platelets: 189 10*3/uL (ref 150–400)
RDW: 20.3 % — ABNORMAL HIGH (ref 11.5–15.5)
WBC: 7.9 10*3/uL (ref 4.0–10.5)

## 2013-06-08 LAB — COMPREHENSIVE METABOLIC PANEL
ALT: 13 U/L (ref 0–35)
Alkaline Phosphatase: 54 U/L (ref 39–117)
BUN: 9 mg/dL (ref 6–23)
CO2: 24 mEq/L (ref 19–32)
Calcium: 10.9 mg/dL — ABNORMAL HIGH (ref 8.4–10.5)
GFR calc Af Amer: >90 mL/min (ref >90)
GFR calc non Af Amer: >90 mL/min (ref >90)
Glucose, Bld: 91 mg/dL (ref 70–99)
Potassium: 3.9 mEq/L (ref 3.5–5.1)
Sodium: 138 mEq/L (ref 135–145)

## 2013-06-08 MED ORDER — HYDROMORPHONE HCL PF 1 MG/ML IJ SOLN
1.0000 mg | Freq: Once | INTRAMUSCULAR | Status: AC
  Administered 2013-06-08: 1 mg via INTRAVENOUS
  Filled 2013-06-08: qty 1

## 2013-06-08 MED ORDER — SODIUM CHLORIDE 0.9 % IV BOLUS (SEPSIS)
1000.0000 mL | Freq: Once | INTRAVENOUS | Status: AC
  Administered 2013-06-08: 1000 mL via INTRAVENOUS

## 2013-06-08 MED ORDER — HYDROMORPHONE HCL PF 2 MG/ML IJ SOLN
2.0000 mg | Freq: Once | INTRAMUSCULAR | Status: AC
  Administered 2013-06-08: 2 mg via INTRAVENOUS
  Filled 2013-06-08: qty 1

## 2013-06-08 NOTE — ED Notes (Signed)
Pt c/o of right knee pain associated with sickle cell. She states that she took her home prescribed pain medication but that it did not relieve the pain. She did not go to the sickle cell but just straight here.

## 2013-06-08 NOTE — Progress Notes (Signed)
[facility] provided patient with a list of primary care resources. Patient stated that she thought she had Medicaid, Registration was not able to verify. Told patient to contact DSS about Medicaid.

## 2013-06-08 NOTE — ED Provider Notes (Signed)
CSN: [id number]     Arrival date & time 06/08/13  1526 History     First MD Initiated Contact with Patient 06/08/13 1550     Chief Complaint  Patient presents with  . Sickle Cell Pain Crisis    right knee pain   (Consider location/radiation/quality/duration/timing/severity/associated sxs/prior Treatment) HPI Comments: 17 year old female with past medical history of sickle cell anemia presents to the emergency department complaining of right knee and lower leg pain beginning 2 days ago. States the pain is the same as her "typical sickle cell pain". Describes pain as sharp, radiating throughout her lower leg, rated 9/10, worse with pressure. She's tried taking her narcotic medication at home without relief. Denies any injury or trauma. Denies joint swelling, fever, chills, chest pain, shortness of breath, nausea or vomiting. No recent illness. Dr. August Saucer manages her sickle cell anemia, however she has not seen him since March and did not try to contact him today.  Patient is a 17 y.o. female presenting with sickle cell pain. The history is provided by the patient.  Sickle Cell Pain Crisis   Past Medical History  Diagnosis Date  . Sickle cell anemia    Past Surgical History  Procedure Laterality Date  . Mouth surgery     History reviewed. No pertinent family history. History  Substance Use Topics  . Smoking status: Never Smoker   . Smokeless tobacco: Not on file  . Alcohol Use: No   OB History   Grav Para Term Preterm Abortions TAB SAB Ect Mult Living                 Review of Systems  Musculoskeletal: Negative for joint swelling.       Positive for right knee and lower leg pain.  All other systems reviewed and are negative.    Allergies  Vancomycin  Home Medications   Current Outpatient Rx  Name  Route  Sig  Dispense  Refill  . aspirin 81 MG tablet   Oral   Take 81 mg by mouth daily.         Marland Kitchen HYDROcodone-acetaminophen (NORCO/VICODIN) 5-325 MG per tablet   Oral   Take 1 tablet by mouth every 6 (six) hours as needed for pain.         . hydroxyurea (HYDREA) 500 MG capsule   Oral   Take 2,000 mg by mouth daily. May take with food to minimize GI side effects.         . folic acid (FOLVITE) 1 MG tablet   Oral   Take 1 tablet (1 mg total) by mouth daily.   30 tablet   0   . Oxycodone HCl 10 MG TABS   Oral   Take 1 tablet (10 mg total) by mouth every 4 (four) hours.   30 tablet   0   . senna-docusate (SENOKOT-S) 8.6-50 MG per tablet   Oral   Take 1 tablet by mouth daily as needed.   30 tablet   0    BP 141/84  Pulse 109  Temp(Src) 98.3 F (36.8 C) (Oral)  Resp 20  SpO2 99%  LMP 05/22/2013 Physical Exam  Nursing note and vitals reviewed. Constitutional: She is oriented to person, place, and time. She appears well-developed and well-nourished. No distress.  HENT:  Head: Normocephalic and atraumatic.  Mouth/Throat: Oropharynx is clear and moist.  Eyes: Conjunctivae and EOM are normal. Pupils are equal, round, and reactive to light.  Neck: Normal range of motion. Neck  supple.  Cardiovascular: Regular rhythm, normal heart sounds, intact distal pulses and normal pulses.  Tachycardia present.   Pulmonary/Chest: Effort normal and breath sounds normal.  Abdominal: Soft. Bowel sounds are normal. There is no tenderness.  Musculoskeletal: Normal range of motion. She exhibits no edema.       Right lower leg: She exhibits tenderness.       Legs: R knee non-tender, full ROM, no swelling or deformity.  Neurological: She is alert and oriented to person, place, and time. She has normal strength. No sensory deficit.  Skin: Skin is warm and dry. She is not diaphoretic.  Psychiatric: She has a normal mood and affect. Her behavior is normal.    ED Course   Procedures (including critical care time)  Labs Reviewed  CBC WITH DIFFERENTIAL - Abnormal; Notable for the following:    Hemoglobin 8.8 (*)    HCT 27.3 (*)    MCV 63.0 (*)    MCH 20.3  (*)    RDW 20.3 (*)    Neutrophils Relative % 79 (*)    All other components within normal limits  RETICULOCYTES - Abnormal; Notable for the following:    Retic Ct Pct 7.7 (*)    Retic Count, Manual 333.4 (*)    All other components within normal limits  COMPREHENSIVE METABOLIC PANEL - Abnormal; Notable for the following:    Calcium 10.9 (*)    Total Bilirubin 3.1 (*)    All other components within normal limits   No results found. 1. Sickle cell anemia with pain     MDM  Patient with her "typical sickle-cell pain". She is in NAD. Vitals stable, very mild tachycardia. Labs pending- cbc, cmp, reticulocytes. Fluid bolus, 2 mg dilaudid for pain. 6:05 PM Laps around patient's baseline. She reports her pain has decreased from a 9/10 to a 5/10. Will give 1 mg Dilaudid, plan to discharge patient. She feels as if she will be to go home after the next dose pain medication. 6:58 PM Patient reports her pain has improved. She is eating a sandwich in NAD. Stable for discharge. Advised her to f/u with Dr. August Saucer. Return precautions discussed. Patient states understanding of treatment care plan and is agreeable.   Trevor Mace, PA-C 06/08/13 1858

## 2013-06-09 NOTE — ED Provider Notes (Signed)
Medical screening examination/treatment/procedure(s) were performed by non-physician practitioner and as supervising physician I was immediately available for consultation/collaboration.   Shanna Cisco, MD 06/09/13 [phone]

## 2013-06-15 ENCOUNTER — Ambulatory Visit: Payer: Medicaid Other | Admitting: Internal Medicine

## 2013-12-12 ENCOUNTER — Emergency Department (HOSPITAL_COMMUNITY)
Admission: EM | Admit: 2013-12-12 | Discharge: 2013-12-12 | Disposition: A | Discharge status: Home / Self Care | Payer: Medicaid Other | Attending: Emergency Medicine | Admitting: Emergency Medicine

## 2013-12-12 ENCOUNTER — Encounter (HOSPITAL_COMMUNITY): Payer: Self-pay | Admitting: Emergency Medicine

## 2013-12-12 DIAGNOSIS — D5700 Hb-SS disease with crisis, unspecified: Secondary | ICD-10-CM | POA: Insufficient documentation

## 2013-12-12 DIAGNOSIS — Z7982 Long term (current) use of aspirin: Secondary | ICD-10-CM | POA: Insufficient documentation

## 2013-12-12 DIAGNOSIS — Z3202 Encounter for pregnancy test, result negative: Secondary | ICD-10-CM | POA: Insufficient documentation

## 2013-12-12 DIAGNOSIS — M549 Dorsalgia, unspecified: Secondary | ICD-10-CM

## 2013-12-12 LAB — URINALYSIS, ROUTINE W REFLEX MICROSCOPIC
Bilirubin Urine: NEGATIVE
GLUCOSE, UA: NEGATIVE mg/dL
HGB URINE DIPSTICK: NEGATIVE
Ketones, ur: NEGATIVE mg/dL
Leukocytes, UA: NEGATIVE
Nitrite: NEGATIVE
PH: 6 (ref 5.0–8.0)
PROTEIN: NEGATIVE mg/dL
SPECIFIC GRAVITY, URINE: 1.013 (ref 1.005–1.030)
Urobilinogen, UA: 4 mg/dL — ABNORMAL HIGH (ref 0.0–1.0)

## 2013-12-12 LAB — POCT PREGNANCY, URINE: PREG TEST UR: NEGATIVE

## 2013-12-12 MED ORDER — HYDROMORPHONE HCL PF 1 MG/ML IJ SOLN
1.0000 mg | Freq: Once | INTRAMUSCULAR | Status: AC
  Administered 2013-12-12: 1 mg via INTRAVENOUS
  Filled 2013-12-12: qty 1

## 2013-12-12 MED ORDER — OXYCODONE-ACETAMINOPHEN 5-325 MG PO TABS: 1.0000 | ORAL_TABLET | ORAL | Status: DC | PRN

## 2013-12-12 NOTE — ED Provider Notes (Signed)
CSN: [id number]     Arrival date & time 12/12/13  0130 History   First MD Initiated Contact with Patient 12/12/13 [phone]     Chief Complaint  Patient presents with  . Sickle Cell Pain Crisis     (Consider location/radiation/quality/duration/timing/severity/associated sxs/prior Treatment) HPI Comments: 18 year old female, history of sickle cell anemia, presents with a complaint of lower back pain. This started at 1:00 AM 3 hours prior to arrival. The pain is persistent, sharp, located in the mid lower back and without radiation. She denies any fevers chills nausea vomiting chest pain coughing or shortness of breath. She initially had bilateral knee pain but that has completely resolved. Currently the pain is 4/10, it did not respond to Tylenol prior to arrival.  Patient is a 18 y.o. female presenting with sickle cell pain. The history is provided by the patient.  Sickle Cell Pain Crisis   Past Medical History  Diagnosis Date  . Sickle cell anemia    Past Surgical History  Procedure Laterality Date  . Mouth surgery     History reviewed. No pertinent family history. History  Substance Use Topics  . Smoking status: Never Smoker   . Smokeless tobacco: Not on file  . Alcohol Use: No   OB History   Grav Para Term Preterm Abortions TAB SAB Ect Mult Living                 Review of Systems  All other systems reviewed and are negative.      Allergies  Vancomycin  Home Medications   Current Outpatient Rx  Name  Route  Sig  Dispense  Refill  . aspirin EC 81 MG tablet   Oral   Take 81 mg by mouth daily.         . hydroxyurea (HYDREA) 500 MG capsule   Oral   Take 2,000 mg by mouth daily. May take with food to minimize GI side effects.         Marland Kitchen. HYDROcodone-acetaminophen (NORCO/VICODIN) 5-325 MG per tablet   Oral   Take 1 tablet by mouth every 6 (six) hours as needed for pain.         Marland Kitchen. oxyCODONE-acetaminophen (PERCOCET) 5-325 MG per tablet   Oral   Take 1 tablet  by mouth every 4 (four) hours as needed.   20 tablet   0    BP 138/79  Pulse 91  Temp(Src) 98.4 F (36.9 C) (Oral)  Resp 22  Ht 5\' 9"  (1.753 m)  Wt 175 lb (79.379 kg)  BMI 25.83 kg/m2  SpO2 99%  LMP 11/02/2013 Physical Exam  Nursing note and vitals reviewed. Constitutional: She appears well-developed and well-nourished. No distress.  HENT:  Head: Normocephalic and atraumatic.  Mouth/Throat: Oropharynx is clear and moist. No oropharyngeal exudate.  Eyes: Conjunctivae and EOM are normal. Pupils are equal, round, and reactive to light. Right eye exhibits no discharge. Left eye exhibits no discharge. No scleral icterus.  Neck: Normal range of motion. Neck supple. No JVD present. No thyromegaly present.  Cardiovascular: Normal rate, regular rhythm, normal heart sounds and intact distal pulses.  Exam reveals no gallop and no friction rub.   No murmur heard. Pulmonary/Chest: Effort normal and breath sounds normal. No respiratory distress. She has no wheezes. She has no rales.  Abdominal: Soft. Bowel sounds are normal. She exhibits no distension and no mass. There is no tenderness.  Musculoskeletal: Normal range of motion. She exhibits tenderness ( Mild tenderness to palpation over the  upper lumbar spine, no masses, no redness, no paraspinal tenderness). She exhibits no edema.  Lymphadenopathy:    She has no cervical adenopathy.  Neurological: She is alert. Coordination normal.  Skin: Skin is warm and dry. No rash noted. No erythema.  Psychiatric: She has a normal mood and affect. Her behavior is normal.    ED Course  Procedures (including critical care time) Labs Review Labs Reviewed  URINALYSIS, ROUTINE W REFLEX MICROSCOPIC - Abnormal; Notable for the following:    Urobilinogen, UA 4.0 (*)    All other components within normal limits  POCT PREGNANCY, URINE   Imaging Review No results found.  EKG Interpretation   None       MDM   Final diagnoses:  Back pain    The  patient has focal tenderness over the lumbar spine, she has normal vital signs, afebrile, no tachycardia, no signs of acute chest syndrome, check for urinalysis, pregnancy, pain medication  Patient has improved significantly, states pain is 1/10, no neurologic symptoms, vital signs normal, as she states she is comfortable going home. See medications below   Meds given in ED:  Medications  HYDROmorphone (DILAUDID) injection 1 mg (1 mg Intravenous Given 12/12/13 0413)    New Prescriptions   OXYCODONE-ACETAMINOPHEN (PERCOCET) 5-325 MG PER TABLET    Take 1 tablet by mouth every 4 (four) hours as needed.      Vida Roller, MD 12/12/13 [phone]

## 2013-12-12 NOTE — ED Notes (Signed)
Pt states she is feeling better and ready to go home.

## 2013-12-12 NOTE — Discharge Instructions (Signed)
Back Pain: ° ° °Your back pain should be treated with medicines such as ibuprofen or aleve and this back pain should get better over the next 2 weeks.  However if you develop severe or worsening pain, low back pain with fever, numbness, weakness or inability to walk or urinate, you should return to the ER immediately.  Please follow up with your doctor this week for a recheck if still having symptoms. °Low back pain is discomfort in the lower back that may be due to injuries to muscles and ligaments around the spine.  Occasionally, it may be caused by a a problem to a part of the spine called a disc.  The pain may last several days or a week;  However, most patients get completely well in 4 weeks. ° °Self - care:  The application of heat can help soothe the pain.  Maintaining your daily activities, including walking, is encourged, as it will help you get better faster than just staying in bed. ° °Medications are also useful to help with pain control.  A commonly prescribed medications includes acetaminophen.  This medication is generally safe, though you should not take more than 8 of the extra strength (500mg) pills a day. ° °Non steroidal anti inflammatory medications including Ibuprofen and naproxen;  These medications help both pain and swelling and are very useful in treating back pain.  They should be taken with food, as they can cause stomach upset, and more seriously, stomach bleeding.   ° °Muscle relaxants:  These medications can help with muscle tightness that is a cause of lower back pain.  Most of these medications can cause drowsiness, and it is not safe to drive or use dangerous machinery while taking them. ° °You will need to follow up with  Your primary healthcare provider in 1-2 weeks for reassessment. ° °Be aware that if you develop new symptoms, such as a fever, leg weakness, difficulty with or loss of control of your urine or bowels, abdominal pain, or more severe pain, you will need to seek  medical attention and  / or return to the Emergency department. ° °If you do not have a doctor see the list below. ° °RESOURCE GUIDE ° °Chronic Pain Problems: °Contact [facility]  [phone] °Patients need to be referred by their primary care doctor. ° °Insufficient Money for Medicine: °Contact [employer] Way:  call "211" or [facility] 271-5999. ° °No Primary Care Doctor: °- Call [facility]  832-8000 - can help you locate a primary care doctor that  accepts your insurance, provides certain services, etc. °- Physician Referral Service- 1-800-533-3463 ° °Agencies that provide inexpensive medical care: °- [facility]  [phone] °- [facility]  [phone] °- [facility]  [phone] °- [facility]  832-4777 °- [facility]  [phone] °- [facility]  [phone] ° °Medicaid-accepting [place] Providers: °- [facility]- [address] ° [phone], Mon-Fri 9am-7pm, Sat 9am-1pm °- [facility]- [address], [address] ° [phone] °- [facility]- [address], [address] ° [phone] °- [facility]- [address] [place] [address] ° [phone] °- [facility]- [address], [address], [phone] ° Only accepts [place] Access Medicaid patients after they have their name  applied to their card ° °Self Pay (no insurance) in [place]: °- Sickle Cell Patients: Dr Eric Dean, [facility] °   [address], [phone] °- [facility]- [address] ° [phone] °      -     [facility] [place]- [address] °      -     [facility]- see information above (Speak to Pam H if you do not have insurance) °      -  [facility]- [address], [phone] °      -  [facility] [place]- [address],  [phone] °      -  [facility]- [address] [place] [address],  [phone] °      -  Dr Osei-Bonsu-  [address], [place], [phone] °      -  [facility]- [address], [phone] °      -  [facility]- [address] [place] [address], [phone], also [address], [phone] °      -    [facility]- [address], [phone], 1st & 3rd Saturday   every month, 10am-1pm ° °1) Find a Doctor and Pay Out of Pocket °Although you won't have to find out who is covered by your insurance plan, it is a good idea to ask around and get recommendations. You will then need to call the office and see if the doctor you have chosen will accept you as a new patient and what types of options they offer for patients who are self-pay. Some doctors offer discounts or will set up payment plans for their patients who do not have insurance, but you will need to ask so you aren't surprised when you get to your appointment. ° °2) Contact Your Local Health Department °Not all health departments have doctors that can see patients for sick visits, but many do, so it is worth a call to see if yours does. If you don't know where your local health department is, you can check in your phone book. The CDC also has a tool to help you locate your state's health department, and many state websites also have listings of all of their local health departments. ° °3) Find a Walk-in Clinic °If your illness is not likely to be very severe or complicated, you may want to try a walk in clinic. These are popping up all over the country in pharmacies, drugstores, and shopping centers. They're usually staffed by nurse practitioners or physician assistants that have been trained to treat common illnesses and complaints. They're usually fairly quick and inexpensive. However, if you have serious medical issues or chronic medical problems, these are probably not your best option ° °STD Testing °- [facility] [place], [facility], [address], [place], phone [phone] or 1-877-539-9860.  Monday - Friday, call for an appointment. °- [place] [place], [facility], [address], [place], phone [phone] or 1-877-539-9860.  Monday - Friday, call for an appointment. ° °Abuse/Neglect: °- [place] Child Abuse Hotline (336) 641-3795 °- [place] Child Abuse Hotline 800-378-5315 (After Hours) ° °Emergency Shelter:  [facility] (336) 271-5985 ° °Maternity Homes: °- [facility] (336) 275-9566 °- [facility] (704) 372-4663 ° °MRSA Hotline #:   832-7006 ° °[place] Resources ° °[facility] [place]  [employer] [facility] [facility] °[address]                 [address]         [address]  °[place]                                               [place]                              [place] °Phone:  349-3220                                  Phone:  342-7768                   Phone:  342-8140 ° °[facility], 342-8316 °- [place] [facility] - [facility]- 1-888-581-9988 °      -     [facility] in [place], [address],                                  336-349-4454, Insurance ° °[place] Child Abuse Hotline °(336) 342-1394 or (336) 342-3537 (After Hours) ° ° °Behavioral Health Services ° °Substance Abuse Resources: °- [facility]  336-882-2125 °- [facility] 336-784-9470 °- [facility] 336-285-9073 °- [facility] 336-845-3988 °- Residential & Outpatient Substance Abuse Program  800-659-3381 ° °Psychological Services: °- [facility]  832-9600 °- [facility]  378-7881 °- [facility], [address], [place], ACCESS LINE: 1-800-853-5163 or 336-641-4981, Http://www.[facility]/services/adult.htm ° °Dental Assistance ° °If unable to pay or  uninsured, contact:  [facility] or [facility] to become qualified for the adult dental clinic. ° °Patients with Medicaid: [facility] °[address], [phone] °[address], [phone] ° °If unable to pay, or uninsured, contact [facility] (271-5999) or [place] (641-3152 in , 842-7733 in [place]) to become qualified for the adult dental clinic ° °Other Low-Cost Community Dental Services: °- [facility]- [address], [place], [place], [address], 723-1848, [address]. [address], 2nd and 4th Thursday of the month at 6:30am.  10 clients each day by appointment, can sometimes see walk-in patients if someone does not show for an appointment. °- [facility]- [address], [place], [place], [address], [phone] °- [facility]- [address], [place], [place], [address], [phone] °- [facility]- 342-8273 °- [facility]- 703-3100 °- [facility]- 570-6415 ° ° ° ° ° ° °

## 2013-12-12 NOTE — ED Notes (Signed)
Pt reports having a sickle cell crisis since 1300 yesterday, reports lower back and leg pain, which is usual for her crises. Pt reports taking Tylenol at home without relief. Pt a&o x4, ambulatory to triage.

## 2014-03-15 ENCOUNTER — Encounter: Payer: Self-pay | Admitting: Internal Medicine

## 2014-03-15 ENCOUNTER — Ambulatory Visit: Payer: Medicaid Other | Admitting: Internal Medicine

## 2014-03-15 VITALS — BP 117/68 | HR 75 | Temp 98.1°F | Resp 20 | Ht 69.0 in | Wt 184.0 lb

## 2014-03-15 DIAGNOSIS — D571 Sickle-cell disease without crisis: Secondary | ICD-10-CM

## 2014-03-15 LAB — CBC WITH DIFFERENTIAL/PLATELET
Basophils Absolute: 0 10*3/uL (ref 0.0–0.1)
Basophils Relative: 0 % (ref 0–1)
Eosinophils Absolute: 0.1 10*3/uL (ref 0.0–0.7)
Eosinophils Relative: 3 % (ref 0–5)
HEMATOCRIT: 28.4 % — AB (ref 36.0–46.0)
HEMOGLOBIN: 9.4 g/dL — AB (ref 12.0–15.0)
LYMPHS ABS: 1 10*3/uL (ref 0.7–4.0)
LYMPHS PCT: 22 % (ref 12–46)
MCH: 20.2 pg — ABNORMAL LOW (ref 26.0–34.0)
MCHC: 33.1 g/dL (ref 30.0–36.0)
MCV: 61.1 fL — ABNORMAL LOW (ref 78.0–100.0)
MONO ABS: 0.3 10*3/uL (ref 0.1–1.0)
MONOS PCT: 7 % (ref 3–12)
NEUTROS ABS: 3.2 10*3/uL (ref 1.7–7.7)
NEUTROS PCT: 68 % (ref 43–77)
Platelets: 215 10*3/uL (ref 150–400)
RBC: 4.65 MIL/uL (ref 3.87–5.11)
RDW: 20.5 % — ABNORMAL HIGH (ref 11.5–15.5)
WBC: 4.7 10*3/uL (ref 4.0–10.5)

## 2014-03-15 LAB — LACTATE DEHYDROGENASE: LDH: 297 U/L — AB (ref 94–250)

## 2014-03-15 LAB — RETICULOCYTES
ABS Retic: 269.7 10*3/uL — ABNORMAL HIGH (ref 19.0–186.0)
RBC.: 4.65 MIL/uL (ref 3.87–5.11)
RETIC CT PCT: 5.8 % — AB (ref 0.4–2.3)

## 2014-03-15 LAB — COMPREHENSIVE METABOLIC PANEL
ALBUMIN: 4.7 g/dL (ref 3.5–5.2)
ALT: 9 U/L (ref 0–35)
AST: 20 U/L (ref 0–37)
Alkaline Phosphatase: 54 U/L (ref 39–117)
BUN: 10 mg/dL (ref 6–23)
CALCIUM: 10.6 mg/dL — AB (ref 8.4–10.5)
CHLORIDE: 107 meq/L (ref 96–112)
CO2: 20 mEq/L (ref 19–32)
Creat: 0.54 mg/dL (ref 0.50–1.10)
GLUCOSE: 83 mg/dL (ref 70–99)
POTASSIUM: 3.9 meq/L (ref 3.5–5.3)
SODIUM: 135 meq/L (ref 135–145)
TOTAL PROTEIN: 7.6 g/dL (ref 6.0–8.3)
Total Bilirubin: 3.3 mg/dL — ABNORMAL HIGH (ref 0.2–1.1)

## 2014-03-15 LAB — FERRITIN: Ferritin: 177 ng/mL (ref 10–291)

## 2014-03-15 NOTE — Progress Notes (Unsigned)
Subjective:    Patient ID: Ana Brooks, female    DOB: Oct 15, 1996, 18 y.o.   MRN: [id number]  HPI: Pt with Hb SS who has not been seen by a Primary Care Physician in over a year who presents stating that she wants a BMT. Pt states pain in    Pt states that she has pain only rarely has pain and takes pain medication only very infrequently.   Pt has poor hydration and eating habits.   Pt has Nexplanon (last implanted 01/05/2014) and sees an Ob-Gyn (Dr[facility] Arly[facility]  Last Pap smear 01/05/2014.      Review of Systems  Constitutional: Negative.   HENT: Negative.   Eyes: Negative.   Respiratory: Negative.  Negative for shortness of breath and wheezing.   Cardiovascular: Negative.   Gastrointestinal: Negative.   Endocrine: Negative.   Genitourinary: Negative.   Skin: Negative.   Allergic/Immunologic: Negative.   Neurological: Negative.   Hematological: Negative.   Psychiatric/Behavioral: Negative.        Objective:   Physical Exam  Constitutional: She is oriented to person, place, and time. She appears well-developed and well-nourished.  HENT:  Head: Normocephalic and atraumatic.  Eyes: Conjunctivae and EOM are normal. Pupils are equal, round, and reactive to light. No scleral icterus.  Neck: Normal range of motion. Neck supple. No JVD present. No thyromegaly present.  Cardiovascular: Normal rate and regular rhythm.  Exam reveals no gallop and no friction rub.   No murmur heard. Pulmonary/Chest: Effort normal and breath sounds normal. She has no wheezes. She has no rales.  Abdominal: Soft. Bowel sounds are normal. She exhibits no distension and no mass. There is no tenderness.  Musculoskeletal: Normal range of motion.  Neurological: She is alert and oriented to person, place, and time. No cranial nerve deficit.  Skin: Skin is warm and dry.  Psychiatric: She has a normal mood and affect. Her behavior is normal. Judgment and thought content normal.           Assessment & Plan:  1. Sickle cell disease  Hb SS- Continue Hydrea 2000 mg daily. We discussed the need for good hydration, monitoring of hydration status, avoidance of heat, cold, stress, and infection triggers. We discussed the risks and benefits of Hydrea, including bone marrow suppression, the possibility of GI upset, skin ulcers, hair thinning, and teratogenicity. The patient was reminded of the need to seek medical attention of any symptoms of bleeding, anemia, or infection. Continue folic acid 1 mg daily to prevent aplastic bone marrow crises.   2. Pulmonary evaluation - Patient denies severe recurrent wheezes, shortness of breath with exercise, or persistent cough. If these symptoms develop, pulmonary function tests with spirometry will be ordered, and if abnormal, plan on referral to Pulmonology for further evaluation.  3. Cardiac - Routine screening for pulmonary hypertension is not recommended.  4. Eye - High risk of proliferative retinopathy. Annual eye exam with retinal exam recommended to patient.  5. Immunization status - She reports being up to date on vaccines. Yearly influenza vaccination is recommended, as well as being up to date with Meningococcal and Pneumococcal vaccines.   6. Acute and chronic painful episodes - We We discussed that she is to receive her Schedule II prescriptions only from us. She is also aware that her prescription history is available to us online through the [facility] CSRS. Controlled substance agreement signed (Date). We reminded (Pt) that all patients receiving Schedule II narcotics must be seen  for follow up every three months. We reviewed the terms of our pain agreement, including the need to keep medicines in a safe locked location away from children or pets, and the need to report excess sedation or constipation, measures to avoid constipation, and policies related to early refills and stolen prescriptions. According to the [place] [facility]  [facility] program, we have reviewed details related to analgesia, adverse effects, aberrant behaviors.  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  Exjade (dose and frequency)  8. Vitamin D deficiency - Drisdol 50,000 units weekly, we encouraged her to take it.   The above recommendations are taken from the NIH Evidence-Based Management of Sickle Cell Disease: Expert Panel Report, [id number].   Chronic medical problems including diabetes, hypertension and COPD. We recommended she try to find a [place]CP in [place].

## 2014-03-19 LAB — HEMOGLOBINOPATHY EVALUATION
HEMOGLOBIN OTHER: 0 %
HGB A2 QUANT: 4.6 % — AB (ref 2.2–3.2)
HGB F QUANT: 22.6 % — AB (ref 0.0–2.0)
Hgb A: 0 % — ABNORMAL LOW (ref 96.8–97.8)
Hgb S Quant: 72.8 % — ABNORMAL HIGH

## 2014-04-16 ENCOUNTER — Ambulatory Visit: Payer: Medicaid Other | Admitting: Internal Medicine

## 2014-04-19 ENCOUNTER — Ambulatory Visit: Payer: Medicaid Other | Admitting: Internal Medicine

## 2014-04-22 ENCOUNTER — Emergency Department (HOSPITAL_COMMUNITY)
Admission: EM | Admit: 2014-04-22 | Discharge: 2014-04-22 | Disposition: A | Discharge status: Home / Self Care | Payer: Medicaid Other | Attending: Emergency Medicine | Admitting: Emergency Medicine

## 2014-04-22 ENCOUNTER — Encounter (HOSPITAL_COMMUNITY): Payer: Self-pay | Admitting: Emergency Medicine

## 2014-04-22 DIAGNOSIS — D5700 Hb-SS disease with crisis, unspecified: Secondary | ICD-10-CM | POA: Insufficient documentation

## 2014-04-22 DIAGNOSIS — Z7982 Long term (current) use of aspirin: Secondary | ICD-10-CM | POA: Insufficient documentation

## 2014-04-22 DIAGNOSIS — I10 Essential (primary) hypertension: Secondary | ICD-10-CM | POA: Insufficient documentation

## 2014-04-22 DIAGNOSIS — R000 Tachycardia, unspecified: Secondary | ICD-10-CM | POA: Insufficient documentation

## 2014-04-22 DIAGNOSIS — J4489 Other specified chronic obstructive pulmonary disease: Secondary | ICD-10-CM | POA: Insufficient documentation

## 2014-04-22 DIAGNOSIS — E119 Type 2 diabetes mellitus without complications: Secondary | ICD-10-CM | POA: Insufficient documentation

## 2014-04-22 DIAGNOSIS — Z79899 Other long term (current) drug therapy: Secondary | ICD-10-CM | POA: Insufficient documentation

## 2014-04-22 DIAGNOSIS — J449 Chronic obstructive pulmonary disease, unspecified: Secondary | ICD-10-CM | POA: Insufficient documentation

## 2014-04-22 HISTORY — DX: Type 2 diabetes mellitus without complications: E11.9

## 2014-04-22 HISTORY — DX: Chronic obstructive pulmonary disease, unspecified: J44.9

## 2014-04-22 HISTORY — DX: Essential (primary) hypertension: I10

## 2014-04-22 LAB — I-STAT CHEM 8, ED
BUN: 9 mg/dL (ref 6–23)
CHLORIDE: 105 meq/L (ref 96–112)
Calcium, Ion: 1.38 mmol/L — ABNORMAL HIGH (ref 1.12–1.23)
Creatinine, Ser: 0.8 mg/dL (ref 0.50–1.10)
Glucose, Bld: 106 mg/dL — ABNORMAL HIGH (ref 70–99)
HEMATOCRIT: 33 % — AB (ref 36.0–46.0)
HEMOGLOBIN: 11.2 g/dL — AB (ref 12.0–15.0)
POTASSIUM: 4 meq/L (ref 3.7–5.3)
SODIUM: 143 meq/L (ref 137–147)
TCO2: 22 mmol/L (ref 0–100)

## 2014-04-22 LAB — RETICULOCYTES
RBC.: 4.47 MIL/uL (ref 3.87–5.11)
RETIC COUNT ABSOLUTE: 321.8 10*3/uL — AB (ref 19.0–186.0)
Retic Ct Pct: 7.2 % — ABNORMAL HIGH (ref 0.4–3.1)

## 2014-04-22 LAB — CBC WITH DIFFERENTIAL/PLATELET
BASOS ABS: 0 10*3/uL (ref 0.0–0.1)
Basophils Relative: 0 % (ref 0–1)
EOS ABS: 0.1 10*3/uL (ref 0.0–0.7)
Eosinophils Relative: 2 % (ref 0–5)
HEMATOCRIT: 27 % — AB (ref 36.0–46.0)
HEMOGLOBIN: 9 g/dL — AB (ref 12.0–15.0)
LYMPHS PCT: 32 % (ref 12–46)
Lymphs Abs: 2.3 10*3/uL (ref 0.7–4.0)
MCH: 20.1 pg — ABNORMAL LOW (ref 26.0–34.0)
MCHC: 33.3 g/dL (ref 30.0–36.0)
MCV: 60.4 fL — ABNORMAL LOW (ref 78.0–100.0)
MONOS PCT: 7 % (ref 3–12)
Monocytes Absolute: 0.5 10*3/uL (ref 0.1–1.0)
NEUTROS ABS: 4.2 10*3/uL (ref 1.7–7.7)
NEUTROS PCT: 59 % (ref 43–77)
Platelets: 161 10*3/uL (ref 150–400)
RBC: 4.47 MIL/uL (ref 3.87–5.11)
RDW: 20.7 % — AB (ref 11.5–15.5)
WBC: 7.1 10*3/uL (ref 4.0–10.5)

## 2014-04-22 MED ORDER — OXYCODONE-ACETAMINOPHEN 5-325 MG PO TABS
2.0000 | ORAL_TABLET | Freq: Once | ORAL | Status: AC
  Administered 2014-04-22: 2 via ORAL
  Filled 2014-04-22: qty 2

## 2014-04-22 MED ORDER — ONDANSETRON HCL 4 MG/2ML IJ SOLN
4.0000 mg | Freq: Once | INTRAMUSCULAR | Status: AC
  Administered 2014-04-22: 4 mg via INTRAVENOUS
  Filled 2014-04-22: qty 2

## 2014-04-22 MED ORDER — SODIUM CHLORIDE 0.9 % IV BOLUS (SEPSIS)
1000.0000 mL | Freq: Once | INTRAVENOUS | Status: AC
  Administered 2014-04-22: 1000 mL via INTRAVENOUS

## 2014-04-22 NOTE — ED Notes (Signed)
Pt arrived to the ED with a complaint of a sickle cell pain crisis.  Pt is hurting on the left lower back. Pt has been experiencing pain for about 30 minutes

## 2014-04-22 NOTE — Discharge Instructions (Signed)
Sickle Cell Anemia, Adult Sickle cell anemia is a condition where your red blood cells are shaped like sickles. Red blood cells carry oxygen through the body. Sickle-shaped red blood cells cells do not live as long as normal red blood cells. They also clump together and block blood from flowing through the blood vessels. These things prevent the body from getting enough oxygen. Sickle cell anemia causes organ damage and pain. It also increases the risk of infection. HOME CARE  Drink enough fluid to keep your pee (urine) clear or pale yellow. Drink more in hot weather and during exercise.  Do not smoke. Smoking lowers oxygen levels in the blood.  Only take over-the-counter or prescription medicines as told by your doctor.  Take antibiotic medicines as told by your doctor. Make sure you finish them it even if you start to feel better.  Take supplements as told by your doctor.  Consider wearing a medical alert bracelet. This tells anyone caring for you in an emergency of your condition.  When traveling, keep your medical information, doctors' names, and the medicines you take with you at all times.  If you have a fever, do not take fever medicines right away. This could cover up a problem. Tell your doctor.   Keep all follow-up appointments with your doctor. Sickle cell anemia requires regular medical care. GET HELP IF: You have a fever. GET HELP RIGHT AWAY IF:  You feel dizzy or faint.  You have new belly (abdominal) pain, especially on the left side near the stomach area.  You have a lasting, often uncomfortable and painful erection of the penis (priapism). If it is not treated right away, you will become unable to have sex (impotence).  You have numbness your arms or legs or you have a hard time moving them.  You have a hard time talking.  You have a fever or lasting symptoms for more than 2-3 days.  You have a fever and your symptoms suddenly get worse.  You have signs or  symptoms of infection. These include:  Chills.  Being more tired than normal (lethargy).  Irritability.  Poor eating.  Throwing up (vomiting).  You have pain that is not helped with medicine.  You have shortness of breath.  You have pain in your chest.  You are coughing up pus-like or bloody mucus.  You have a stiff neck.  Your feet or hands swell or have pain.  Your belly looks bloated.  Your joints hurt. MAKE SURE YOU:  Understand these instructions.  Will watch your condition.  Will get help right away if you are not doing well or get worse. Document Released: 08/09/2013 Document Reviewed: 08/09/2013 [facility] Patient Information 2015 [employer] [place]. This information is not intended to replace advice given to you by your health care provider. Make sure you discuss any questions you have with your health care provider. Continue to take your medication os a regular basis for pain control  Follow up with Dr. Ashley RoyaltyMatthews on Monday

## 2014-04-22 NOTE — ED Provider Notes (Signed)
CSN: [id number]     Arrival date & time 04/22/14  0058 History   First MD Initiated Contact with Patient 04/22/14 0124     Chief Complaint  Patient presents with  . Sickle Cell Pain Crisis     (Consider location/radiation/quality/duration/timing/severity/associated sxs/prior Treatment) HPI Comments: Pateint started  Having back pain -- normal site for her pain crisis about 30 minutes PTA did not take any of her home medications. Denies SOB, N/V/D,  states has been dinking enough fluids LMP 1 week ago ended several days ago   Patient is a 18 y.o. female presenting with sickle cell pain. The history is provided by the patient.  Sickle Cell Pain Crisis Location:  Back Severity:  Moderate Onset quality:  Gradual Duration:  1 hour Similar to previous crisis episodes: yes   Timing:  Constant Progression:  Worsening Chronicity:  Recurrent Sickle cell genotype:  [place] Frequency of attacks:  Infrequent History of pulmonary emboli: no   Context: menses   Context: not alcohol consumption, not change in medication, not dehydration, not low humidity and not pregnancy   Relieved by:  None tried Worsened by:  Nothing tried Ineffective treatments:  None tried Associated symptoms: no chest pain, no cough, no fever, no headaches, no nausea and no shortness of breath   Risk factors: cholecystectomy and lack of social support   Risk factors: no frequent admissions for pain, no frequent pain crises, no hx of pneumonia, no hx of stroke, no history of acute chest syndrome, no recent air travel and not smoking     Past Medical History  Diagnosis Date  . Sickle cell anemia   . Allergy   . Diabetes mellitus without complication   . COPD (chronic obstructive pulmonary disease)   . Hypertension    Past Surgical History  Procedure Laterality Date  . Mouth surgery    . Cholecystectomy     Family History  Problem Relation Age of Onset  . Adopted: Yes   History  Substance Use Topics  . Smoking  status: Never Smoker   . Smokeless tobacco: Not on file  . Alcohol Use: No   OB History   Grav Para Term Preterm Abortions TAB SAB Ect Mult Living                 Review of Systems  Constitutional: Negative for fever.  Respiratory: Negative for cough and shortness of breath.   Cardiovascular: Negative for chest pain and leg swelling.  Gastrointestinal: Negative for nausea.  Genitourinary: Negative for dysuria and flank pain.  Musculoskeletal: Positive for back pain.  Skin: Negative for rash.  Neurological: Negative for headaches.  All other systems reviewed and are negative.     Allergies  Vancomycin  Home Medications   Prior to Admission medications   Medication Sig Start Date End Date Taking? Authorizing Provider  aspirin EC 81 MG tablet Take 81 mg by mouth daily.   Yes Historical Provider, MD  HYDROcodone-acetaminophen (NORCO/VICODIN) 5-325 MG per tablet Take 1 tablet by mouth every 6 (six) hours as needed for pain.   Yes Historical Provider, MD  hydroxyurea (HYDREA) 500 MG capsule Take 2,000 mg by mouth daily. May take with food to minimize GI side effects.   Yes Historical Provider, MD  loratadine (CLARITIN REDITABS) 10 MG dissolvable tablet Take 10 mg by mouth daily.   Yes Historical Provider, MD  oxyCODONE-acetaminophen (PERCOCET) 5-325 MG per tablet Take 1 tablet by mouth every 4 (four) hours as needed. 12/12/13  Yes  Vida RollerBrian D Miller, MD   BP 119/60  Pulse 73  Temp(Src) 98.6 F (37 C) (Oral)  Resp 18  SpO2 100%  LMP 04/08/2014 Physical Exam  Nursing note and vitals reviewed. Constitutional: She is oriented to person, place, and time. She appears well-developed and well-nourished.  HENT:  Head: Normocephalic.  Mouth/Throat: Oropharynx is clear and moist.  Eyes: Pupils are equal, round, and reactive to light.  Neck: Normal range of motion.  Cardiovascular: Regular rhythm.  Tachycardia present.   Pulmonary/Chest: Effort normal and breath sounds normal. No  respiratory distress. She has no wheezes. She exhibits no tenderness.  Abdominal: Soft. She exhibits no distension. There is no tenderness.  Musculoskeletal: Normal range of motion. She exhibits no edema and no tenderness.  Neurological: She is alert and oriented to person, place, and time.  Skin: Skin is warm and dry. No rash noted. No erythema.    ED Course  Procedures (including critical care time) Labs Review Labs Reviewed  CBC WITH DIFFERENTIAL - Abnormal; Notable for the following:    Hemoglobin 9.0 (*)    HCT 27.0 (*)    MCV 60.4 (*)    MCH 20.1 (*)    RDW 20.7 (*)    All other components within normal limits  RETICULOCYTES - Abnormal; Notable for the following:    Retic Ct Pct 7.2 (*)    Retic Count, Manual 321.8 (*)    All other components within normal limits  I-STAT CHEM 8, ED - Abnormal; Notable for the following:    Glucose, Bld 106 (*)    Calcium, Ion 1.38 (*)    Hemoglobin 11.2 (*)    HCT 33.0 (*)    All other components within normal limits    Imaging Review No results found.   EKG Interpretation None      MDM  Patient states pain in significantly decreased and is ready to go home will FU with Sickle Cell clinic on Monday  Final diagnoses:  Sickle cell crisis         Arman FilterGail K Schulz, NP 04/22/14 2112  Arman FilterGail K Schulz, NP 04/22/14 2113

## 2014-04-24 NOTE — ED Provider Notes (Signed)
Medical screening examination/treatment/procedure(s) were performed by non-physician practitioner and as supervising physician I was immediately available for consultation/collaboration.   EKG Interpretation None        Kathleen M McManus, DO 04/24/14 1815 

## 2014-04-26 ENCOUNTER — Encounter: Payer: Self-pay | Admitting: Internal Medicine

## 2014-04-26 ENCOUNTER — Ambulatory Visit (INDEPENDENT_AMBULATORY_CARE_PROVIDER_SITE_OTHER): Payer: Medicaid Other | Admitting: Internal Medicine

## 2014-04-26 VITALS — BP 116/59 | HR 60 | Temp 98.4°F | Resp 20 | Ht 69.0 in | Wt 189.0 lb

## 2014-04-26 DIAGNOSIS — Z23 Encounter for immunization: Secondary | ICD-10-CM | POA: Diagnosis not present

## 2014-04-26 DIAGNOSIS — D571 Sickle-cell disease without crisis: Secondary | ICD-10-CM

## 2014-04-26 NOTE — Progress Notes (Signed)
Subjective:     Patient ID: Ana Brooks, female   DOB: 1996-07-31, 18 y.o.   MRN: [id number]  HPI: Pt here for follow-up for Sickle Cell Disease. Since her last visit, patient was seen in the ED for Sickle Cell Crisis. She states that she took 1 Percocet and then went to the ED. She has not had any further pain.   I have discussed with patient that she should always call our office first before going to the emergency room. I reminded patient that we have call coverage 24/7 available to address and direct the medical concerns of our patients.   Review of Systems  Constitutional: Negative.   HENT: Negative.   Eyes: Negative.   Respiratory: Negative.   Cardiovascular: Negative.   Gastrointestinal: Negative.   Endocrine: Negative.   Genitourinary: Negative.   Skin: Negative.   Allergic/Immunologic: Negative.   Neurological: Negative.   Hematological: Negative.   Psychiatric/Behavioral: Negative.        Objective:   Physical Exam  Vitals reviewed. Constitutional: She is oriented to person, place, and time. She appears well-developed and well-nourished.  HENT:  Head: Normocephalic and atraumatic.  Eyes: Conjunctivae and EOM are normal. Pupils are equal, round, and reactive to light. No scleral icterus.  Neck: Normal range of motion. Neck supple. No JVD present. No thyromegaly present.  Cardiovascular: Normal rate and regular rhythm.  Exam reveals no gallop and no friction rub.   No murmur heard. Pulmonary/Chest: Effort normal and breath sounds normal. She has no wheezes. She has no rales.  Abdominal: Soft. Bowel sounds are normal. She exhibits no distension and no mass. There is no tenderness.  Musculoskeletal: Normal range of motion.  Neurological: She is alert and oriented to person, place, and time. No cranial nerve deficit.  Skin: Skin is warm and dry.  Psychiatric: She has a normal mood and affect. Her behavior is normal. Judgment and thought content normal.        Assessment:     1. Hb SS without crisis: Pt's indices are within normal limits. We have discussed increasing Hydrea as she is not at her MTD.  However patient is resistant to increasing dose.  2. Immunization: Pt due for TdAP.     Plan:     - Continue Hydrea 2000 mg daily and folic acid 1 mg daily.  - Tdap will be given today     RTC: 3 months

## 2014-07-27 ENCOUNTER — Other Ambulatory Visit: Payer: Medicaid Other

## 2014-07-27 ENCOUNTER — Ambulatory Visit (INDEPENDENT_AMBULATORY_CARE_PROVIDER_SITE_OTHER): Payer: Medicaid Other | Admitting: Family Medicine

## 2014-07-27 ENCOUNTER — Other Ambulatory Visit: Payer: Self-pay | Admitting: Internal Medicine

## 2014-07-27 VITALS — BP 124/65 | HR 68 | Temp 98.2°F | Resp 16 | Ht 69.0 in | Wt 188.0 lb

## 2014-07-27 DIAGNOSIS — Z23 Encounter for immunization: Secondary | ICD-10-CM

## 2014-07-27 DIAGNOSIS — T50905S Adverse effect of unspecified drugs, medicaments and biological substances, sequela: Secondary | ICD-10-CM

## 2014-07-27 DIAGNOSIS — D571 Sickle-cell disease without crisis: Secondary | ICD-10-CM

## 2014-07-27 LAB — COMPLETE METABOLIC PANEL WITH GFR
ALBUMIN: 4.1 g/dL (ref 3.5–5.2)
ALK PHOS: 50 U/L (ref 39–117)
ALT: 8 U/L (ref 0–35)
AST: 16 U/L (ref 0–37)
BUN: 9 mg/dL (ref 6–23)
CO2: 25 mEq/L (ref 19–32)
Calcium: 9.7 mg/dL (ref 8.4–10.5)
Chloride: 111 mEq/L (ref 96–112)
Creat: 0.65 mg/dL (ref 0.50–1.10)
GFR, Est African American: >89 mL/min
GFR, Est Non African American: >89 mL/min
Glucose, Bld: 89 mg/dL (ref 70–99)
POTASSIUM: 3.5 meq/L (ref 3.5–5.3)
SODIUM: 141 meq/L (ref 135–145)
TOTAL PROTEIN: 6.4 g/dL (ref 6.0–8.3)
Total Bilirubin: 2.8 mg/dL — ABNORMAL HIGH (ref 0.2–1.2)

## 2014-07-27 MED ORDER — FOLIC ACID 1 MG PO TABS: 1.0000 mg | ORAL_TABLET | Freq: Every day | ORAL | Status: AC

## 2014-07-27 NOTE — Patient Instructions (Signed)
Sickle Cell Anemia, Adult Sickle cell anemia is a condition in which red blood cells have an abnormal "sickle" shape. This abnormal shape shortens the cells' life span, which results in a lower than normal concentration of red blood cells in the blood. The sickle shape also causes the cells to clump together and block free blood flow through the blood vessels. As a result, the tissues and organs of the body do not receive enough oxygen. Sickle cell anemia causes organ damage and pain and increases the risk of infection. CAUSES  Sickle cell anemia is a genetic disorder. Those who receive two copies of the gene have the condition, and those who receive one copy have the trait. RISK FACTORS The sickle cell gene is most common in people whose families originated in [place]. Other areas of the globe where sickle cell trait occurs include the Mediterranean, [place] and [place], the [place], and the [place].  SIGNS AND SYMPTOMS  Pain, especially in the extremities, back, chest, or abdomen (common). The pain may start suddenly or may develop following an illness, especially if there is dehydration. Pain can also occur due to overexertion or exposure to extreme temperature changes.  Frequent severe bacterial infections, especially certain types of pneumonia and meningitis.  Pain and swelling in the hands and feet.  Decreased activity.   Loss of appetite.   Change in behavior.  Headaches.  Seizures.  Shortness of breath or difficulty breathing.  Vision changes.  Skin ulcers. Those with the trait may not have symptoms or they may have mild symptoms.  DIAGNOSIS  Sickle cell anemia is diagnosed with blood tests that demonstrate the genetic trait. It is often diagnosed during the newborn period, due to mandatory testing nationwide. A variety of blood tests, X-rays, CT scans, MRI scans, ultrasounds, and lung function tests may also be done to monitor the condition. TREATMENT  Sickle  cell anemia may be treated with:  Medicines. You may be given pain medicines, antibiotic medicines (to treat and prevent infections) or medicines to increase the production of certain types of hemoglobin.  Fluids.  Oxygen.  Blood transfusions. HOME CARE INSTRUCTIONS   Drink enough fluid to keep your urine clear or pale yellow. Increase your fluid intake in hot weather and during exercise.  Do not smoke. Smoking lowers oxygen levels in the blood.   Only take over-the-counter or prescription medicines for pain, fever, or discomfort as directed by your health care provider.  Take antibiotics as directed by your health care provider. Make sure you finish them it even if you start to feel better.   Take supplements as directed by your health care provider.   Consider wearing a medical alert bracelet. This tells anyone caring for you in an emergency of your condition.   When traveling, keep your medical information, health care provider's names, and the medicines you take with you at all times.   If you develop a fever, do not take medicines to reduce the fever right away. This could cover up a problem that is developing. Notify your health care provider.  Keep all follow-up appointments with your health care provider. Sickle cell anemia requires regular medical care. SEEK MEDICAL CARE IF: You have a fever. SEEK IMMEDIATE MEDICAL CARE IF:   You feel dizzy or faint.   You have new abdominal pain, especially on the left side near the stomach area.   You develop a persistent, often uncomfortable and painful penile erection (priapism). If this is not treated immediately it   will lead to impotence.   You have numbness your arms or legs or you have a hard time moving them.   You have a hard time with speech.   You have a fever or persistent symptoms for more than 2-3 days.   You have a fever and your symptoms suddenly get worse.   You have signs or symptoms of infection.  These include:   Chills.   Abnormal tiredness (lethargy).   Irritability.   Poor eating.   Vomiting.   You develop pain that is not helped with medicine.   You develop shortness of breath.  You have pain in your chest.   You are coughing up pus-like or bloody sputum.   You develop a stiff neck.  Your feet or hands swell or have pain.  Your abdomen appears bloated.  You develop joint pain. MAKE SURE YOU:  Understand these instructions. Document Released: 01/27/2006 Document Revised: 03/05/2014 Document Reviewed: 05/31/2013 [facility] Patient Information 2015 [place], [place]. This information is not intended to replace advice given to you by your health care provider. Make sure you discuss any questions you have with your health care provider. Sickle Cell Anemia, Adult Sickle cell anemia is a condition in which red blood cells have an abnormal "sickle" shape. This abnormal shape shortens the cells' life span, which results in a lower than normal concentration of red blood cells in the blood. The sickle shape also causes the cells to clump together and block free blood flow through the blood vessels. As a result, the tissues and organs of the body do not receive enough oxygen. Sickle cell anemia causes organ damage and pain and increases the risk of infection. CAUSES  Sickle cell anemia is a genetic disorder. Those who receive two copies of the gene have the condition, and those who receive one copy have the trait. RISK FACTORS The sickle cell gene is most common in people whose families originated in [place]. Other areas of the globe where sickle cell trait occurs include the [place], [place] and [place], and the [place].  SIGNS AND SYMPTOMS  Pain, especially in the extremities, back, chest, or abdomen (common). The pain may start suddenly or may develop following an illness, especially if there is dehydration. Pain can also occur due to  overexertion or exposure to extreme temperature changes.  Frequent severe bacterial infections, especially certain types of pneumonia and meningitis.  Pain and swelling in the hands and feet.  Decreased activity.   Loss of appetite.   Change in behavior.  Headaches.  Seizures.  Shortness of breath or difficulty breathing.  Vision changes.  Skin ulcers. Those with the trait may not have symptoms or they may have mild symptoms.  DIAGNOSIS  Sickle cell anemia is diagnosed with blood tests that demonstrate the genetic trait. It is often diagnosed during the newborn period, due to mandatory testing nationwide. A variety of blood tests, X-rays, CT scans, MRI scans, ultrasounds, and lung function tests may also be done to monitor the condition. TREATMENT  Sickle cell anemia may be treated with:  Medicines. You may be given pain medicines, antibiotic medicines (to treat and prevent infections) or medicines to increase the production of certain types of hemoglobin.  Fluids.  Oxygen.  Blood transfusions. HOME CARE INSTRUCTIONS   Drink enough fluid to keep your urine clear or pale yellow. Increase your fluid intake in hot weather and during exercise.  Do not smoke. Smoking lowers oxygen levels in the blood.  Only take over-the-counter or prescription medicines for pain, fever, or discomfort as directed by your health care provider.  Take antibiotics as directed by your health care provider. Make sure you finish them it even if you start to feel better.   Take supplements as directed by your health care provider.   Consider wearing a medical alert bracelet. This tells anyone caring for you in an emergency of your condition.   When traveling, keep your medical information, health care provider's names, and the medicines you take with you at all times.   If you develop a fever, do not take medicines to reduce the fever right away. This could cover up a problem that is  developing. Notify your health care provider.  Keep all follow-up appointments with your health care provider. Sickle cell anemia requires regular medical care. SEEK MEDICAL CARE IF: You have a fever. SEEK IMMEDIATE MEDICAL CARE IF:   You feel dizzy or faint.   You have new abdominal pain, especially on the left side near the stomach area.   You develop a persistent, often uncomfortable and painful penile erection (priapism). If this is not treated immediately it will lead to impotence.   You have numbness your arms or legs or you have a hard time moving them.   You have a hard time with speech.   You have a fever or persistent symptoms for more than 2-3 days.   You have a fever and your symptoms suddenly get worse.   You have signs or symptoms of infection. These include:   Chills.   Abnormal tiredness (lethargy).   Irritability.   Poor eating.   Vomiting.   You develop pain that is not helped with medicine.   You develop shortness of breath.  You have pain in your chest.   You are coughing up pus-like or bloody sputum.   You develop a stiff neck.  Your feet or hands swell or have pain.  Your abdomen appears bloated.  You develop joint pain. MAKE SURE YOU:  Understand these instructions. Document Released: 01/27/2006 Document Revised: 03/05/2014 Document Reviewed: 05/31/2013 [facility] Patient Information 2015 [place], [place]. This information is not intended to replace advice given to you by your health care provider. Make sure you discuss any questions you have with your health care provider. Hand Washing Staying healthy is important to you and your entire family. Follow these easy, low-cost steps to help stop many infectious diseases before they happen. HOW TO [facility]  Wet your hands and apply liquid, bar, or powder soap.  Rub hands together vigorously to make a lather and scrub all surfaces. Be sure to clean between the fingers and around  the nails.  Continue for 20 seconds! It takes that long for the soap and scrubbing action to dislodge and remove stubborn germs.  Rinse hands well under running water.  Dry your hands using a paper towel or air dryer.  If possible, use your paper towel or elbow to turn off the faucet. This will help avoid re-exposure to germs on the handle. WHEN TO [facility] YOUR HANDS  Before and after eating.  Before, during, and after handling or preparing food.  After contact with blood or body fluids (like vomit, nasal secretions, or saliva). This means washing after you blow your nose!  Before and after changing a diaper.  After you use the bathroom.  After handling animals, their toys, leashes, or waste.  After touching something that could be contaminated (such as a trash can,  cleaning cloth, drain, or soil).  Before and after taking care of (dressing) a wound, giving medicine, or inserting contact lenses.  More often when someone in your home is sick.  Whenever your hands become soiled. If soap and water are not available, use an alcohol-based wipe or hand gel. Keeping your hands clean is one of the best ways to keep from getting sick and spreading illnesses. Cleaning your hands gets rid of germs you pick up:  From other people.  From the surfaces you touch.  From the animals you come in contact with. Document Released: 06/09/2005 Document Revised: 02/13/2013 Document Reviewed: 11/14/2008 [facility] Patient Information 2015 [place], [place]. This information is not intended to replace advice given to you by your health care provider. Make sure you discuss any questions you have with your health care provider.

## 2014-07-27 NOTE — Progress Notes (Signed)
   Subjective:    Patient ID: Ana Brooks, female    DOB: 1996-01-16, 18 y.o.   MRN: [id number]  HPI  Patient presents accompanied by mother for a 3 month follow up of sickle cell anemia, HbSS. She maintains that she feels well and is taking hydroxyurea consistently. She reports that she is not on folic acid therapy. She is currently not having pain and has not required pain medication for the past several months. She states that she has been working increased hours at [employer], but is remaining rested and hydrated during time off.    Review of Systems  Constitutional: Positive for fatigue (occasional).  HENT: Negative.   Eyes: Negative.   Respiratory: Negative.   Cardiovascular: Negative.   Gastrointestinal: Negative.   Endocrine: Negative.   Genitourinary: Negative.   Musculoskeletal: Negative.   Skin: Negative.   Allergic/Immunologic: Negative.   Neurological: Negative.   Hematological: Negative.   Psychiatric/Behavioral: Negative.        Objective:   Physical Exam  Constitutional: She is oriented to person, place, and time. She appears well-developed and well-nourished.  HENT:  Head: Normocephalic and atraumatic.  Right Ear: External ear normal.  Left Ear: External ear normal.  Mouth/Throat: Oropharynx is clear and moist.  Eyes: Conjunctivae and EOM are normal. Pupils are equal, round, and reactive to light.  Neck: Normal range of motion. Neck supple.  Cardiovascular: Normal rate, regular rhythm, normal heart sounds and intact distal pulses.   Pulmonary/Chest: Effort normal and breath sounds normal.  Abdominal: Soft. Bowel sounds are normal.  Musculoskeletal: Normal range of motion.  Neurological: She is alert and oriented to person, place, and time. She has normal reflexes.  Skin: Skin is warm and dry.  Psychiatric: She has a normal mood and affect. Her behavior is normal. Judgment and thought content normal.      BP 124/65  Pulse 68   Temp(Src) 98.2 F (36.8 C) (Oral)  Resp 16  Ht  (1.753 m)  Wt 188 lb (85.276 kg)  BMI 27.75 kg/m2  LMP 07/03/2014   Assessment & Plan:   1. Sickle cell disease, type SS Continue Hydrea 500 mg twice daily. We discussed the need for good hydration, monitoring of hydration status, avoidance of heat, cold, stress, and infection triggers. We discussed the risks and benefits of Hydrea, including bone marrow suppression, the possibility of GI upset, skin ulcers, hair thinning, and teratogenicity. The patient was reminded of the need to seek medical attention of any symptoms of bleeding, anemia, or infection. Continue folic acid 1 mg daily to prevent aplastic bone marrow crises.  - folic acid (FOLVITE) 1 MG tablet; Take 1 tablet (1 mg total) by mouth daily.  Dispense: 30 tablet; Refill: 6 - CBC with Differential - COMPLETE METABOLIC PANEL WITH GFR - Vitamin D, 25-hydroxy Eye - High risk of proliferative retinopathy. Annual eye exam with retinal exam recommended to patient.  2. Need for immunization against influenza - Flu Vaccine QUAD 36+ mos IM (Fluarix) -Immunization status - Reviewed [facility], up to date. Yearly influenza vaccination is recommended, as well as being up to date with Meningococcal and Pneumococcal vaccines.   Massie Maroon, FNP

## 2014-07-28 LAB — CBC WITH DIFFERENTIAL/PLATELET
BASOS ABS: 0 10*3/uL (ref 0.0–0.1)
Basophils Relative: 0 % (ref 0–1)
Eosinophils Absolute: 0.1 10*3/uL (ref 0.0–0.7)
Eosinophils Relative: 1 % (ref 0–5)
HCT: 26.3 % — ABNORMAL LOW (ref 36.0–46.0)
HEMOGLOBIN: 8.4 g/dL — AB (ref 12.0–15.0)
Lymphocytes Relative: 28 % (ref 12–46)
Lymphs Abs: 1.6 10*3/uL (ref 0.7–4.0)
MCH: 19.9 pg — ABNORMAL LOW (ref 26.0–34.0)
MCHC: 31.9 g/dL (ref 30.0–36.0)
MCV: 62.3 fL — ABNORMAL LOW (ref 78.0–100.0)
MONOS PCT: 9 % (ref 3–12)
Monocytes Absolute: 0.5 10*3/uL (ref 0.1–1.0)
NEUTROS ABS: 3.5 10*3/uL (ref 1.7–7.7)
NEUTROS PCT: 62 % (ref 43–77)
Platelets: 253 10*3/uL (ref 150–400)
RBC: 4.22 MIL/uL (ref 3.87–5.11)
RDW: 21.8 % — ABNORMAL HIGH (ref 11.5–15.5)
WBC: 5.7 10*3/uL (ref 4.0–10.5)

## 2014-07-28 LAB — VITAMIN D 25 HYDROXY (VIT D DEFICIENCY, FRACTURES): Vit D, 25-Hydroxy: <10 ng/mL — ABNORMAL LOW (ref 30–89)

## 2014-07-31 ENCOUNTER — Telehealth: Payer: Self-pay | Admitting: Family Medicine

## 2014-07-31 DIAGNOSIS — E559 Vitamin D deficiency, unspecified: Secondary | ICD-10-CM

## 2014-07-31 MED ORDER — ERGOCALCIFEROL 1.25 MG (50000 UT) PO CAPS: 50000.0000 [IU] | ORAL_CAPSULE | ORAL | Status: DC

## 2014-07-31 NOTE — Telephone Encounter (Signed)
Called and advised patient of vitamin D levels and to start on vitamin D capsules once a week. Patient verbalized understanding.

## 2014-07-31 NOTE — Telephone Encounter (Signed)
Meds ordered this encounter  Medications  . ergocalciferol (DRISDOL) 50000 UNITS capsule    Sig: Take 1 capsule (50,000 Units total) by mouth once a week.    Dispense:  4 capsule    Refill:  4    Order Specific Question:  Supervising Provider    Answer:  Marthann SchillerMATTHEWS, MICHELLE A [[id number]]  Massie MaroonHollis,Markeya Mincy M, FNP

## 2014-08-01 ENCOUNTER — Encounter: Payer: Self-pay | Admitting: Family Medicine

## 2014-09-10 ENCOUNTER — Telehealth: Payer: Self-pay | Admitting: Internal Medicine

## 2014-09-10 DIAGNOSIS — D571 Sickle-cell disease without crisis: Secondary | ICD-10-CM

## 2014-09-10 MED ORDER — OXYCODONE-ACETAMINOPHEN 5-325 MG PO TABS: 1.0000 | ORAL_TABLET | ORAL | Status: DC | PRN

## 2014-09-10 NOTE — Telephone Encounter (Signed)
Refill request for Percocet. 5/325mg  LOV 07/31/2014 Please advise. Thanks!

## 2014-09-10 NOTE — Telephone Encounter (Signed)
Meds ordered this encounter  Medications  . oxyCODONE-acetaminophen (PERCOCET) 5-325 MG per tablet    Sig: Take 1 tablet by mouth every 4 (four) hours as needed for severe pain.    Dispense:  30 tablet    Refill:  0    Order Specific Question:  Supervising Provider    Answer:  Marthann SchillerMATTHEWS, MICHELLE A [[id number]]  Reviewed [place] Substance Reporting system prior to reorder Massie MaroonHollis,Godfrey Tritschler M, FNP

## 2014-11-01 ENCOUNTER — Encounter: Payer: Self-pay | Admitting: Family Medicine

## 2014-11-01 ENCOUNTER — Ambulatory Visit (INDEPENDENT_AMBULATORY_CARE_PROVIDER_SITE_OTHER): Payer: Medicaid Other | Admitting: Family Medicine

## 2014-11-01 VITALS — BP 124/80 | HR 74 | Temp 98.4°F | Resp 16 | Ht 69.0 in | Wt 177.0 lb

## 2014-11-01 DIAGNOSIS — Z23 Encounter for immunization: Secondary | ICD-10-CM | POA: Diagnosis not present

## 2014-11-01 DIAGNOSIS — Z8679 Personal history of other diseases of the circulatory system: Secondary | ICD-10-CM

## 2014-11-01 DIAGNOSIS — D5700 Hb-SS disease with crisis, unspecified: Secondary | ICD-10-CM

## 2014-11-01 DIAGNOSIS — D72829 Elevated white blood cell count, unspecified: Secondary | ICD-10-CM

## 2014-11-01 DIAGNOSIS — E559 Vitamin D deficiency, unspecified: Secondary | ICD-10-CM

## 2014-11-01 LAB — CBC WITH DIFFERENTIAL/PLATELET
Basophils Absolute: 0 10*3/uL (ref 0.0–0.1)
Basophils Relative: 0 % (ref 0–1)
EOS PCT: 1 % (ref 0–5)
Eosinophils Absolute: 0.1 10*3/uL (ref 0.0–0.7)
HCT: 28 % — ABNORMAL LOW (ref 36.0–46.0)
HEMOGLOBIN: 9 g/dL — AB (ref 12.0–15.0)
LYMPHS PCT: 21 % (ref 12–46)
Lymphs Abs: 1.1 10*3/uL (ref 0.7–4.0)
MCH: 20 pg — AB (ref 26.0–34.0)
MCHC: 32.1 g/dL (ref 30.0–36.0)
MCV: 62.2 fL — ABNORMAL LOW (ref 78.0–100.0)
MONO ABS: 0.4 10*3/uL (ref 0.1–1.0)
Monocytes Relative: 7 % (ref 3–12)
Neutro Abs: 3.8 10*3/uL (ref 1.7–7.7)
Neutrophils Relative %: 71 % (ref 43–77)
PLATELETS: 291 10*3/uL (ref 150–400)
RBC: 4.5 MIL/uL (ref 3.87–5.11)
RDW: 21.2 % — ABNORMAL HIGH (ref 11.5–15.5)
WBC: 5.4 10*3/uL (ref 4.0–10.5)

## 2014-11-01 LAB — VITAMIN D 25 HYDROXY (VIT D DEFICIENCY, FRACTURES): Vit D, 25-Hydroxy: 7 ng/mL — ABNORMAL LOW (ref 30–100)

## 2014-11-01 NOTE — Progress Notes (Signed)
Subjective:    Patient ID: Ana Brooks, female    DOB: 1996-01-30, 18 y.o.   MRN: [id number]  HPI Ms. Ana Brooks, patient with a history of sickle cell anemia, HbSS presents accompanied by mother for a 3 month follow up of sickle cell anemia. She states that she has been taking medications consistently. She states that she has been hydrating frequently and has a fairly balanced diet. She reports that she rarely has pain and takes pain medication very infrequently. She currently denies headache, blurred vision,  weakness, shortness of breath, nausea, vomiting, and diarrhea.   Past Medical History  Diagnosis Date  . Sickle cell anemia   . Allergy   . Diabetes mellitus without complication   . COPD (chronic obstructive pulmonary disease)   . Hypertension     Review of Systems  Constitutional: Negative.  Negative for fatigue.  HENT: Negative.   Eyes: Negative.   Respiratory: Negative.  Negative for cough, shortness of breath and wheezing.   Cardiovascular: Negative.   Gastrointestinal: Negative.  Negative for nausea, diarrhea and constipation.  Endocrine: Negative.   Genitourinary: Negative.   Musculoskeletal: Negative.   Skin: Negative.   Allergic/Immunologic: Negative.   Neurological: Negative.  Negative for dizziness, tremors, syncope, weakness, light-headedness, numbness and headaches.  Hematological: Negative.   Psychiatric/Behavioral: Negative.   All other systems reviewed and are negative.      Objective:   Physical Exam  Constitutional: She is oriented to person, place, and time. She appears well-developed and well-nourished.  HENT:  Head: Normocephalic and atraumatic.  Right Ear: Hearing, tympanic membrane and external ear normal.  Left Ear: Hearing, tympanic membrane and external ear normal.  Nose: Nose normal.  Mouth/Throat: Uvula is midline, oropharynx is clear and moist and mucous membranes are normal.  Eyes: Conjunctivae, EOM and lids are  normal. Pupils are equal, round, and reactive to light. Lids are everted and swept, no foreign bodies found. No scleral icterus.  Neck: Trachea normal and normal range of motion. Neck supple.  Cardiovascular: Normal rate, regular rhythm, normal heart sounds, intact distal pulses and normal pulses.   Pulmonary/Chest: Effort normal and breath sounds normal.  Abdominal: Soft. Normal appearance and bowel sounds are normal.  Musculoskeletal: Normal range of motion.  Neurological: She is alert and oriented to person, place, and time. She has normal reflexes.  Skin: Skin is warm and dry.  Psychiatric: She has a normal mood and affect. Her behavior is normal. Judgment and thought content normal.         BP 124/80 mmHg  Pulse 74  Temp(Src) 98.4 F (36.9 C) (Oral)  Resp 16  Ht 5\' 9"  (1.753 m)  Wt 177 lb (80.287 kg)  BMI 26.13 kg/m2  LMP 11/01/2014 Assessment & Plan:    1. Sickle cell crisis  Continue Hydrea 500 mg twice daily. We discussed the need for good hydration, monitoring of hydration status, avoidance of heat, cold, stress, and infection triggers. We discussed the risks and benefits of Hydrea, including bone marrow suppression, the possibility of GI upset, skin ulcers, hair thinning, and teratogenicity. The patient was reminded of the need to seek medical attention of any symptoms of bleeding, anemia, or infection. Continue folic acid 1 mg daily to prevent aplastic bone marrow crises.   Pulmonary evaluation - Patient denies severe recurrent wheezes, shortness of breath with exercise, or persistent cough. If these symptoms develop, pulmonary function tests with spirometry will be ordered, and if abnormal, plan on referral to Pulmonology for further  evaluation.  Cardiac - Routine screening for pulmonary hypertension is not recommended.   Eye - High risk of proliferative retinopathy. Annual eye exam with retinal exam recommended to patient. Patient has not had an eye examination in  several years. I will send referral for eye examination.   Immunization status -  Yearly influenza vaccination is recommended, as well as being up to date with Meningococcal and Pneumococcal vaccines. Patient had Prevnar 13 in 2012. I will give pneumovax 23 today. All other vaccinations up to date.   Acute and chronic painful episodes - Patient rarely takes pain medications. She states that she only takes Percocet 5-325 for severe pain. She states that it has been several weeks since she has experienced pain.    Vitamin D deficiency - Drisdol 50,000 units was started in September. She states that she has been taking weekly. I will re-check Vitamin D today.    The above recommendations are taken from the NIH Evidence-Based Management of Sickle Cell Disease: Expert Panel Report, [id number].    - CBC with Differential - Ambulatory referral to Ophthalmology  2. History of Moyamoya syndrome Patient's mother reports a history of Moyamoya syndrome. [facility]. Patient will likely need a transcranial doppler to assess.   3. Vitamin D deficiency - Vitamin D, 25-hydroxy   4. Immunization due  - Pneumococcal polysaccharide vaccine 23-valent greater than or equal to 2yo subcutaneous/IM    Ana Brooks,Lachina M, FNP

## 2014-11-05 ENCOUNTER — Telehealth: Payer: Self-pay | Admitting: Family Medicine

## 2014-11-05 ENCOUNTER — Observation Stay (HOSPITAL_COMMUNITY)
Admission: AD | Admit: 2014-11-05 | Discharge: 2014-11-05 | Disposition: A | Discharge status: Home / Self Care | Payer: Medicaid Other | Attending: Internal Medicine | Admitting: Internal Medicine

## 2014-11-05 ENCOUNTER — Telehealth (HOSPITAL_COMMUNITY): Payer: Self-pay | Admitting: Hematology

## 2014-11-05 ENCOUNTER — Encounter (HOSPITAL_COMMUNITY): Payer: Self-pay | Admitting: Hematology

## 2014-11-05 DIAGNOSIS — Z881 Allergy status to other antibiotic agents status: Secondary | ICD-10-CM | POA: Insufficient documentation

## 2014-11-05 DIAGNOSIS — D5700 Hb-SS disease with crisis, unspecified: Secondary | ICD-10-CM | POA: Diagnosis not present

## 2014-11-05 DIAGNOSIS — Z7982 Long term (current) use of aspirin: Secondary | ICD-10-CM | POA: Insufficient documentation

## 2014-11-05 DIAGNOSIS — D571 Sickle-cell disease without crisis: Secondary | ICD-10-CM | POA: Diagnosis present

## 2014-11-05 DIAGNOSIS — E119 Type 2 diabetes mellitus without complications: Secondary | ICD-10-CM | POA: Diagnosis not present

## 2014-11-05 DIAGNOSIS — D57219 Sickle-cell/Hb-C disease with crisis, unspecified: Secondary | ICD-10-CM

## 2014-11-05 DIAGNOSIS — J449 Chronic obstructive pulmonary disease, unspecified: Secondary | ICD-10-CM | POA: Diagnosis not present

## 2014-11-05 DIAGNOSIS — Z79899 Other long term (current) drug therapy: Secondary | ICD-10-CM | POA: Insufficient documentation

## 2014-11-05 DIAGNOSIS — I10 Essential (primary) hypertension: Secondary | ICD-10-CM | POA: Insufficient documentation

## 2014-11-05 DIAGNOSIS — E559 Vitamin D deficiency, unspecified: Secondary | ICD-10-CM

## 2014-11-05 LAB — CBC WITH DIFFERENTIAL/PLATELET
BASOS ABS: 0 10*3/uL (ref 0.0–0.1)
Basophils Relative: 0 % (ref 0–1)
Eosinophils Absolute: 0 10*3/uL (ref 0.0–0.7)
Eosinophils Relative: 0 % (ref 0–5)
HEMATOCRIT: 27.9 % — AB (ref 36.0–46.0)
Hemoglobin: 8.9 g/dL — ABNORMAL LOW (ref 12.0–15.0)
Lymphocytes Relative: 6 % — ABNORMAL LOW (ref 12–46)
Lymphs Abs: 0.8 10*3/uL (ref 0.7–4.0)
MCH: 20 pg — ABNORMAL LOW (ref 26.0–34.0)
MCHC: 31.9 g/dL (ref 30.0–36.0)
MCV: 62.7 fL — AB (ref 78.0–100.0)
Monocytes Absolute: 0.8 10*3/uL (ref 0.1–1.0)
Monocytes Relative: 6 % (ref 3–12)
NEUTROS ABS: 10.9 10*3/uL — AB (ref 1.7–7.7)
Neutrophils Relative %: 88 % — ABNORMAL HIGH (ref 43–77)
Platelets: 217 10*3/uL (ref 150–400)
RBC: 4.45 MIL/uL (ref 3.87–5.11)
RDW: 20.6 % — ABNORMAL HIGH (ref 11.5–15.5)
WBC: 12.5 10*3/uL — ABNORMAL HIGH (ref 4.0–10.5)
nRBC: 1 /100 WBC — ABNORMAL HIGH

## 2014-11-05 LAB — RETICULOCYTES
RBC.: 4.45 MIL/uL (ref 3.87–5.11)
RETIC CT PCT: 7.5 % — AB (ref 0.4–3.1)
Retic Count, Absolute: 333.8 10*3/uL — ABNORMAL HIGH (ref 19.0–186.0)

## 2014-11-05 LAB — COMPREHENSIVE METABOLIC PANEL
ALT: 18 U/L (ref 0–35)
ANION GAP: 10 (ref 5–15)
AST: 35 U/L (ref 0–37)
Albumin: 4.7 g/dL (ref 3.5–5.2)
Alkaline Phosphatase: 57 U/L (ref 39–117)
BUN: 9 mg/dL (ref 6–23)
CO2: 22 mmol/L (ref 19–32)
CREATININE: 0.49 mg/dL — AB (ref 0.50–1.10)
Calcium: 10 mg/dL (ref 8.4–10.5)
Chloride: 104 mEq/L (ref 96–112)
Glucose, Bld: 125 mg/dL — ABNORMAL HIGH (ref 70–99)
Potassium: 3.6 mmol/L (ref 3.5–5.1)
Sodium: 136 mmol/L (ref 135–145)
Total Bilirubin: 3.4 mg/dL — ABNORMAL HIGH (ref 0.3–1.2)
Total Protein: 7.6 g/dL (ref 6.0–8.3)

## 2014-11-05 LAB — LACTATE DEHYDROGENASE: LDH: 333 U/L — ABNORMAL HIGH (ref 94–250)

## 2014-11-05 MED ORDER — KETOROLAC TROMETHAMINE 30 MG/ML IJ SOLN
30.0000 mg | Freq: Four times a day (QID) | INTRAMUSCULAR | Status: DC
  Administered 2014-11-05: 30 mg via INTRAVENOUS
  Filled 2014-11-05: qty 1

## 2014-11-05 MED ORDER — DIPHENHYDRAMINE HCL 12.5 MG/5ML PO ELIX: 12.5000 mg | ORAL_SOLUTION | Freq: Four times a day (QID) | ORAL | Status: DC | PRN

## 2014-11-05 MED ORDER — HYDROMORPHONE 2 MG/ML HIGH CONCENTRATION IV PCA SOLN
INTRAVENOUS | Status: DC
  Administered 2014-11-05: 12:00:00 via INTRAVENOUS
  Administered 2014-11-05: 7.8 mg via INTRAVENOUS
  Filled 2014-11-05: qty 25

## 2014-11-05 MED ORDER — SODIUM CHLORIDE 0.9 % IV SOLN
12.5000 mg | Freq: Four times a day (QID) | INTRAVENOUS | Status: DC | PRN
  Filled 2014-11-05: qty 0.25

## 2014-11-05 MED ORDER — NALOXONE HCL 0.4 MG/ML IJ SOLN: 0.4000 mg | INTRAMUSCULAR | Status: DC | PRN

## 2014-11-05 MED ORDER — HYDROMORPHONE HCL 2 MG/ML IJ SOLN
1.0000 mg | Freq: Once | INTRAMUSCULAR | Status: AC
  Administered 2014-11-05: 1 mg via INTRAVENOUS
  Filled 2014-11-05: qty 1

## 2014-11-05 MED ORDER — DEXTROSE-NACL 5-0.45 % IV SOLN
INTRAVENOUS | Status: DC
  Administered 2014-11-05: 10:00:00 via INTRAVENOUS

## 2014-11-05 MED ORDER — ERGOCALCIFEROL 1.25 MG (50000 UT) PO CAPS: 50000.0000 [IU] | ORAL_CAPSULE | ORAL | Status: AC

## 2014-11-05 MED ORDER — ONDANSETRON HCL 4 MG/2ML IJ SOLN: 4.0000 mg | Freq: Four times a day (QID) | INTRAMUSCULAR | Status: DC | PRN

## 2014-11-05 MED ORDER — HYDROMORPHONE HCL 2 MG/ML IJ SOLN
1.2000 mg | Freq: Once | INTRAMUSCULAR | Status: AC
  Administered 2014-11-05: 1.2 mg via INTRAVENOUS
  Filled 2014-11-05: qty 1

## 2014-11-05 MED ORDER — OXYCODONE-ACETAMINOPHEN 5-325 MG PO TABS
1.0000 | ORAL_TABLET | Freq: Once | ORAL | Status: AC
Start: 2014-11-05 — End: 2014-11-05
  Administered 2014-11-05: 1 via ORAL
  Filled 2014-11-05: qty 1

## 2014-11-05 MED ORDER — SODIUM CHLORIDE 0.9 % IJ SOLN: 9.0000 mL | INTRAMUSCULAR | Status: DC | PRN

## 2014-11-05 NOTE — Telephone Encounter (Signed)
   Meds ordered this encounter  Medications  . ergocalciferol (DRISDOL) 50000 UNITS capsule    Sig: Take 1 capsule (50,000 Units total) by mouth once a week.    Dispense:  4 capsule    Refill:  4    Order Specific Question:  Supervising Provider    Answer:  Marthann Schiller A [[id number]]  Massie Maroon, FNP

## 2014-11-05 NOTE — Progress Notes (Signed)
Patient ID: Ana Brooks, female   DOB: [birth year], 19 y.o.   MRN: [id number] Discharge instructions given to patient, along with follow up information.  IV removed without difficulty.  Patient states pain is now 0/10 on pain scale.  Patient waiting for husband for transportation.

## 2014-11-05 NOTE — H&P (Signed)
[facility] History and Physical  Ajeenah Terrilyn Saver ZOX:[id number] DOB: August 11, 1996 DOA: 11/05/2014   PCP: MATTHEWS,MICHELLE A., MD   Chief Complaint: pain  Ana Brooks is a 19 yo female with sickle cell (Hgb SS) who presents to the day hospital with complaints of pain in her knees and elbows. She states that pain started yesterday. She tried taking her home Percocet but has not found relief. She rates her pain as a 10/10. She denies any fever, cough, HA, dizziness, vomiting and diarrhea.     Review of Systems:  Constitutional: No weight loss, night sweats, Fevers, chills, fatigue.  HEENT: No headaches, dizziness, seizures, vision changes, difficulty swallowing,Tooth/dental problems,Sore throat, No sneezing, itching, ear ache, nasal congestion, post nasal drip,  Cardio-vascular: No chest pain, Orthopnea, PND, swelling in lower extremities, anasarca, dizziness, palpitations  GI: No heartburn, indigestion, abdominal pain, nausea, vomiting, diarrhea, change in bowel habits, loss of appetite  Resp: No shortness of breath with exertion or at rest. No excess mucus, no productive cough, No non-productive cough, No coughing up of blood.No change in color of mucus.No wheezing.No chest wall deformity  Skin: no rash or lesions.  GU: no dysuria, change in color of urine, no urgency or frequency. No flank pain.  Musculoskeletal: + joint pain. No swelling. No decreased range of motion. No back pain.  Psych: No change in mood or affect. No depression or anxiety. No memory loss.    Past Medical History  Diagnosis Date  . Sickle cell anemia   . Allergy   . Diabetes mellitus without complication   . COPD (chronic obstructive pulmonary disease)   . Hypertension    Past Surgical History  Procedure Laterality Date  . Mouth surgery    . Cholecystectomy     Social History:  reports that she has never smoked. She does not have any smokeless tobacco history on file. She reports  that she does not drink alcohol or use illicit drugs.  Allergies  Allergen Reactions  . Vancomycin Hives    Family History  Problem Relation Age of Onset  . Adopted: Yes    Prior to Admission medications   Medication Sig Start Date End Date Taking? Authorizing Provider  aspirin EC 81 MG tablet Take 81 mg by mouth daily.   Yes Historical Provider, MD  ergocalciferol (DRISDOL) 50000 UNITS capsule Take 1 capsule (50,000 Units total) by mouth once a week. 07/31/14  Yes Massie Maroon, FNP  folic acid (FOLVITE) 1 MG tablet Take 1 tablet (1 mg total) by mouth daily. 07/27/14  Yes Massie Maroon, FNP  hydroxyurea (HYDREA) 500 MG capsule Take 2,000 mg by mouth daily. May take with food to minimize GI side effects.   Yes Historical Provider, MD  loratadine (CLARITIN REDITABS) 10 MG dissolvable tablet Take 10 mg by mouth daily.   Yes Historical Provider, MD  oxyCODONE-acetaminophen (PERCOCET) 5-325 MG per tablet Take 1 tablet by mouth every 4 (four) hours as needed for severe pain. 09/10/14  Yes Massie Maroon, FNP   Physical Exam: Filed Vitals:   11/05/14 0914  BP: 159/73  Pulse: 80  Temp: 99.2 F (37.3 C)  TempSrc: Oral  Resp: 20  Height:  (1.753 m)  Weight: 177 lb (80.287 kg)  SpO2: 100%   General: Alert, awake, oriented x3, in no acute distress.  HEENT: [place]/AT PEERL, EOMI Neck: Trachea midline,  no masses, no thyromegal,y no JVD, no carotid bruit OROPHARYNX:  Moist, No exudate/ erythema/lesions.  Lymph: no  lymphadenopathy Heart: Regular rate and rhythm, without murmurs, rubs, gallops, PMI non-displaced, no heaves or thrills on palpation.  Lungs: Clear to auscultation, no wheezing or rhonchi noted. No increased vocal fremitus resonant to percussion  Abdomen: Soft, nontender, nondistended, positive bowel sounds, no masses no hepatosplenomegaly noted..  Neuro: No focal neurological deficits noted cranial nerves II through XII grossly intact. Musculoskeletal: No swelling,  erythema, or warmth of the joints. Extremities: no clubbing, cyanosis, or edema  Labs on Admission:   Basic Metabolic Panel:  Recent Labs Lab 11/05/14 0920  NA 136  K 3.6  CL 104  CO2 22  GLUCOSE 125*  BUN 9  CREATININE 0.49*  CALCIUM 10.0   Liver Function Tests:  Recent Labs Lab 11/05/14 0920  AST 35  ALT 18  ALKPHOS 57  BILITOT 3.4*  PROT 7.6  ALBUMIN 4.7   CBC:  Recent Labs Lab 11/01/14 1441 11/05/14 0920  WBC 5.4 12.5*  NEUTROABS 3.8 10.9*  HGB 9.0* 8.9*  HCT 28.0* 27.9*  MCV 62.2* 62.7*  PLT 291 217   BNP: Invalid input(s): POCBNP   Radiological Exams on Admission: No results found.   Assessment/Plan: Active Problems:   Sickle cell anemia with crisis Pt will be treated with initially treated with weight based rapid re-dosing Dilaudid, IVF and Toradol . If pain is above  7/10 then Pt will be transitioned to a weight based Dilaudid PCA . Patient will be re-evaluated for pain in the context of function and relationship to baseline as care progresses.  Time spend: 40 min Code Status: full Family Communication: non Disposition Plan: pending improvement  Serina Cowper, MD  Pager [phone]  If 7PM-7AM, please contact night-coverage [email or web address] Password [email or web address] 11/05/2014, 11:41 AM

## 2014-11-05 NOTE — Discharge Summary (Signed)
Physician Discharge Summary  Ana Brooks ZOX:[id number] DOB: 12/29/1995 DOA: 11/05/2014  PCP: MATTHEWS,MICHELLE A., MD  Admit date: 11/05/2014 Discharge date: 11/05/2014  Discharge Diagnoses:  Active Problems:   Sickle cell anemia with crisis   Discharge Condition: improved/stable  Disposition: home Follow-up Information    Follow up with MATTHEWS,MICHELLE A., MD.   Specialty:  Internal Medicine   Why:  As needed   Contact information:   [address] [place] [address] [phone]       Diet:regular  Wt Readings from Last 3 Encounters:  11/05/14 177 lb (80.287 kg)  11/01/14 177 lb (80.287 kg)  07/27/14 188 lb (85.276 kg)    History of present illness:  Ana Brooks is a 19 yo female with sickle cell (Hgb SS) who presents to the day hospital with complaints of pain in her knees and elbows. She states that pain started yesterday. She tried taking her home Percocet but has not found relief. She rates her pain as a 10/10. She denies any fever, cough, HA, dizziness, vomiting and diarrhea.   Hospital Course:  Pt was initially treated with weight based rapid re-dosing IVF, Dilaudid IV, and Toradol . Pt the required Dilaudid PCA and subsequently her pain fell to a reported 0/10. She was given her home dose of Percocet and was re-assesed for improvement and was physically functional upon discharge. Pt was advised to take her Percocet every 4-6 hours for 24 hours then as needed. She should also drink plenty of fluids to stay hydrated at home.  Discharge Exam:  Filed Vitals:   11/05/14 1430  BP: 122/65  Pulse: 68  Temp: 97.6 F (36.4 C)  Resp: 14   Filed Vitals:   11/05/14 0914 11/05/14 1143 11/05/14 1325 11/05/14 1430  BP: 159/73  114/61 122/65  Pulse: 80  70 68  Temp: 99.2 F (37.3 C)  97.8 F (36.6 C) 97.6 F (36.4 C)  TempSrc: Oral  Oral Oral  Resp: Height:  (1.753 m)     Weight: 177 lb (80.287 kg)     SpO2: 100% 100% 100%  100%    General: Alert, awake, oriented x3, in no acute distress.  HEENT: [place]/AT PEERL, EOMI Neck: Trachea midline,  no masses, no thyromegal,y no JVD, no carotid bruit OROPHARYNX:  Moist, No exudate/ erythema/lesions.  Heart: Regular rate and rhythm, without murmurs, rubs, gallops Lungs: Clear to auscultation, no wheezing or rhonchi noted.  Abdomen: Soft, nontender, nondistended, positive bowel sounds, no masses no hepatosplenomegaly noted..  Neuro: No focal neurological deficits noted cranial nerves II through XII grossly intact. DTRs 2+ bilaterally upper and lower extremities. Strength 5 out of 5 in bilateral upper and lower extremities. Musculoskeletal: No warm swelling or erythema around joints, no spinal tenderness noted. Nl gait without assistance.  Psychiatric: Patient alert and oriented x3, good insight and cognition, good recent to remote recall. Lymph node survey: No cervical axillary or inguinal lymphadenopathy noted.   Discharge Instructions As above    Medication List    TAKE these medications        aspirin EC 81 MG tablet  Take 81 mg by mouth daily.     ergocalciferol 50000 UNITS capsule  Commonly known as:  DRISDOL  Take 1 capsule (50,000 Units total) by mouth once a week.     folic acid 1 MG tablet  Commonly known as:  FOLVITE  Take 1 tablet (1 mg total) by mouth daily.  hydroxyurea 500 MG capsule  Commonly known as:  HYDREA  Take 2,000 mg by mouth daily. May take with food to minimize GI side effects.     loratadine 10 MG dissolvable tablet  Commonly known as:  CLARITIN REDITABS  Take 10 mg by mouth daily.     oxyCODONE-acetaminophen 5-325 MG per tablet  Commonly known as:  PERCOCET  Take 1 tablet by mouth every 4 (four) hours as needed for severe pain.          The results of significant diagnostics from this hospitalization (including imaging, microbiology, ancillary and laboratory) are listed below for reference.    Significant Diagnostic  Studies: No results found.  Microbiology: No results found for this or any previous visit (from the past 240 hour(s)).   Labs: Basic Metabolic Panel:  Recent Labs Lab 11/05/14 0920  NA 136  K 3.6  CL 104  CO2 22  GLUCOSE 125*  BUN 9  CREATININE 0.49*  CALCIUM 10.0   Liver Function Tests:  Recent Labs Lab 11/05/14 0920  AST 35  ALT 18  ALKPHOS 57  BILITOT 3.4*  PROT 7.6  ALBUMIN 4.7   No results for input(s): LIPASE, AMYLASE in the last 168 hours. No results for input(s): AMMONIA in the last 168 hours. CBC:  Recent Labs Lab 11/01/14 1441 11/05/14 0920  WBC 5.4 12.5*  NEUTROABS 3.8 10.9*  HGB 9.0* 8.9*  HCT 28.0* 27.9*  MCV 62.2* 62.7*  PLT 291 217    Time coordinating discharge: 40 min  Signed:  Serina Cowper  11/05/2014, 4:05 PM

## 2014-11-05 NOTE — Telephone Encounter (Signed)
Spoke with Dr. Nathanial Millman.  Advised patient that is it ok to come to [facility].  Patient verbalizes understanding.

## 2014-11-05 NOTE — Telephone Encounter (Signed)
Patient C/O pain to knees bilateral and elbow that she rates 10/10.  Patient states she has taken oxycodone three times without improvement.  Patient denies N/V,D, denies abodminal pain, patient denies chest pain or shortness of breath.  I explained I would notify the physician and give her a call back.  Patient verbalizes understanding.

## 2014-11-06 ENCOUNTER — Telehealth (HOSPITAL_COMMUNITY): Payer: Self-pay | Admitting: Hematology

## 2014-11-06 ENCOUNTER — Non-Acute Institutional Stay (HOSPITAL_COMMUNITY)
Admission: EM | Admit: 2014-11-06 | Discharge: 2014-11-06 | Disposition: A | Discharge status: Home / Self Care | Payer: Medicaid Other | Attending: Emergency Medicine | Admitting: Emergency Medicine

## 2014-11-06 ENCOUNTER — Encounter (HOSPITAL_COMMUNITY): Payer: Self-pay

## 2014-11-06 ENCOUNTER — Other Ambulatory Visit: Payer: Self-pay | Admitting: Hematology

## 2014-11-06 ENCOUNTER — Ambulatory Visit: Payer: Medicaid Other | Admitting: Family Medicine

## 2014-11-06 DIAGNOSIS — D571 Sickle-cell disease without crisis: Secondary | ICD-10-CM | POA: Diagnosis not present

## 2014-11-06 DIAGNOSIS — D5700 Hb-SS disease with crisis, unspecified: Secondary | ICD-10-CM

## 2014-11-06 DIAGNOSIS — D57819 Other sickle-cell disorders with crisis, unspecified: Secondary | ICD-10-CM | POA: Diagnosis present

## 2014-11-06 LAB — BASIC METABOLIC PANEL
Anion gap: 8 (ref 5–15)
BUN: 7 mg/dL (ref 6–23)
CHLORIDE: 106 meq/L (ref 96–112)
CO2: 22 mmol/L (ref 19–32)
CREATININE: 0.52 mg/dL (ref 0.50–1.10)
Calcium: 10.5 mg/dL (ref 8.4–10.5)
GFR calc Af Amer: >90 mL/min (ref >90)
GFR calc non Af Amer: >90 mL/min (ref >90)
GLUCOSE: 105 mg/dL — AB (ref 70–99)
Potassium: 3.7 mmol/L (ref 3.5–5.1)
Sodium: 136 mmol/L (ref 135–145)

## 2014-11-06 LAB — CBC WITH DIFFERENTIAL/PLATELET
Basophils Absolute: 0 10*3/uL (ref 0.0–0.1)
Basophils Relative: 0 % (ref 0–1)
Eosinophils Absolute: 0 10*3/uL (ref 0.0–0.7)
Eosinophils Relative: 0 % (ref 0–5)
HCT: 27.9 % — ABNORMAL LOW (ref 36.0–46.0)
Hemoglobin: 8.8 g/dL — ABNORMAL LOW (ref 12.0–15.0)
Lymphocytes Relative: 9 % — ABNORMAL LOW (ref 12–46)
Lymphs Abs: 0.8 10*3/uL (ref 0.7–4.0)
MCH: 19.7 pg — ABNORMAL LOW (ref 26.0–34.0)
MCHC: 31.5 g/dL (ref 30.0–36.0)
MCV: 62.6 fL — ABNORMAL LOW (ref 78.0–100.0)
MONO ABS: 0.7 10*3/uL (ref 0.1–1.0)
Monocytes Relative: 7 % (ref 3–12)
NEUTROS PCT: 84 % — AB (ref 43–77)
Neutro Abs: 7.8 10*3/uL — ABNORMAL HIGH (ref 1.7–7.7)
PLATELETS: 193 10*3/uL (ref 150–400)
RBC: 4.46 MIL/uL (ref 3.87–5.11)
RDW: 20.5 % — ABNORMAL HIGH (ref 11.5–15.5)
WBC: 9.3 10*3/uL (ref 4.0–10.5)

## 2014-11-06 LAB — RETICULOCYTES
RBC.: 4.46 MIL/uL (ref 3.87–5.11)
Retic Count, Absolute: 316.7 10*3/uL — ABNORMAL HIGH (ref 19.0–186.0)
Retic Ct Pct: 7.1 % — ABNORMAL HIGH (ref 0.4–3.1)

## 2014-11-06 LAB — PREGNANCY, URINE: PREG TEST UR: NEGATIVE

## 2014-11-06 MED ORDER — KETOROLAC TROMETHAMINE 30 MG/ML IJ SOLN
30.0000 mg | Freq: Once | INTRAMUSCULAR | Status: AC
  Administered 2014-11-06: 30 mg via INTRAVENOUS
  Filled 2014-11-06: qty 1

## 2014-11-06 MED ORDER — HYDROXYUREA 500 MG PO CAPS: 2000.0000 mg | ORAL_CAPSULE | Freq: Every day | ORAL | Status: AC

## 2014-11-06 MED ORDER — HYDROMORPHONE HCL 2 MG/ML IJ SOLN
2.0000 mg | Freq: Once | INTRAMUSCULAR | Status: AC
Start: 2014-11-06 — End: 2014-11-06
  Administered 2014-11-06: 2 mg via INTRAVENOUS
  Filled 2014-11-06: qty 1

## 2014-11-06 MED ORDER — HYDROXYUREA 500 MG PO CAPS
2000.0000 mg | ORAL_CAPSULE | Freq: Every day | ORAL | Status: DC
  Administered 2014-11-06: 2000 mg via ORAL
  Filled 2014-11-06: qty 4

## 2014-11-06 MED ORDER — OXYCODONE HCL 10 MG PO TABS: 10.0000 mg | ORAL_TABLET | ORAL | Status: AC | PRN

## 2014-11-06 MED ORDER — HYDROMORPHONE HCL 2 MG/ML IJ SOLN
2.0000 mg | INTRAMUSCULAR | Status: DC | PRN
  Administered 2014-11-06: 2 mg via INTRAVENOUS
  Filled 2014-11-06: qty 1

## 2014-11-06 MED ORDER — SODIUM CHLORIDE 0.9 % IV BOLUS (SEPSIS)
1000.0000 mL | Freq: Once | INTRAVENOUS | Status: AC
  Administered 2014-11-06: 1000 mL via INTRAVENOUS

## 2014-11-06 MED ORDER — NALOXONE HCL 0.4 MG/ML IJ SOLN: 0.4000 mg | INTRAMUSCULAR | Status: DC | PRN

## 2014-11-06 MED ORDER — HYDROMORPHONE 2 MG/ML HIGH CONCENTRATION IV PCA SOLN
INTRAVENOUS | Status: DC
Start: 2014-11-06 — End: 2014-11-06
  Administered 2014-11-06: 5.5 mg via INTRAVENOUS
  Administered 2014-11-06: 15:00:00 via INTRAVENOUS
  Filled 2014-11-06: qty 25

## 2014-11-06 MED ORDER — HYDROMORPHONE HCL 1 MG/ML IJ SOLN
1.0000 mg | INTRAMUSCULAR | Status: AC
  Administered 2014-11-06: 1 mg via INTRAVENOUS
  Filled 2014-11-06: qty 1

## 2014-11-06 MED ORDER — DEXTROSE-NACL 5-0.45 % IV SOLN
INTRAVENOUS | Status: DC
  Administered 2014-11-06: 14:00:00 via INTRAVENOUS

## 2014-11-06 MED ORDER — SODIUM CHLORIDE 0.9 % IJ SOLN: 9.0000 mL | INTRAMUSCULAR | Status: DC | PRN

## 2014-11-06 MED ORDER — ONDANSETRON HCL 4 MG/2ML IJ SOLN
4.0000 mg | Freq: Once | INTRAMUSCULAR | Status: AC
  Administered 2014-11-06: 4 mg via INTRAVENOUS
  Filled 2014-11-06: qty 2

## 2014-11-06 MED ORDER — HYDROMORPHONE HCL 2 MG/ML IJ SOLN
2.0000 mg | Freq: Once | INTRAMUSCULAR | Status: AC
  Administered 2014-11-06: 2 mg via INTRAVENOUS
  Filled 2014-11-06: qty 1

## 2014-11-06 MED ORDER — ONDANSETRON HCL 4 MG/2ML IJ SOLN: 4.0000 mg | Freq: Four times a day (QID) | INTRAMUSCULAR | Status: DC | PRN

## 2014-11-06 MED ORDER — DIPHENHYDRAMINE HCL 50 MG/ML IJ SOLN: 25.0000 mg | Freq: Four times a day (QID) | INTRAMUSCULAR | Status: DC | PRN

## 2014-11-06 MED ORDER — OXYCODONE HCL 5 MG PO TABS
10.0000 mg | ORAL_TABLET | Freq: Once | ORAL | Status: AC
  Administered 2014-11-06: 10 mg via ORAL
  Filled 2014-11-06: qty 2

## 2014-11-06 MED ORDER — DIPHENHYDRAMINE HCL 25 MG PO CAPS
25.0000 mg | ORAL_CAPSULE | Freq: Four times a day (QID) | ORAL | Status: DC | PRN
  Administered 2014-11-06: 25 mg via ORAL
  Filled 2014-11-06: qty 1

## 2014-11-06 NOTE — Telephone Encounter (Signed)
Called patient and advised that per doctor, because of the elevated temperature, she would need to be seen in the doctor's office.  Explained that the day hospital is only for pain crisis.  Explained that if she couldn't wait for doctor's appointment and felt it urgent enough, she could go to ED.  Patient declined to make an office visit appointment.

## 2014-11-06 NOTE — Telephone Encounter (Signed)
Patient made aware when she came to day hospital today 1/5/206@ 1:40pm. Thanks!

## 2014-11-06 NOTE — ED Provider Notes (Signed)
CSN: [id number]     Arrival date & time 11/06/14  1011 History   First MD Initiated Contact with Patient 11/06/14 1024     Chief Complaint  Patient presents with  . Sickle Cell Pain Crisis   (Consider location/radiation/quality/duration/timing/severity/associated sxs/prior Treatment) HPI Ana Brooks is a 19 yo female presenting with bilat knee pain and right arm pain x 2 days.  She states she normally takes percocet at home 2 pills every 4 hours for pain, however this regimen did not help during this latest episode. She rates her pain 10/10. She reports fever of 101.4 at home today.  She denies any recent illness or stressors.  She also denies cough, dysuria, chest pain, shortness of breath, abd pain, nausea, or vomiting.   Past Medical History  Diagnosis Date  . Sickle cell anemia   . Allergy   . Diabetes mellitus without complication   . COPD (chronic obstructive pulmonary disease)   . Hypertension    Past Surgical History  Procedure Laterality Date  . Mouth surgery    . Cholecystectomy     Family History  Problem Relation Age of Onset  . Adopted: Yes   History  Substance Use Topics  . Smoking status: Never Smoker   . Smokeless tobacco: Not on file  . Alcohol Use: No   OB History    No data available     Review of Systems  Constitutional: Negative for fever and chills.  HENT: Negative for sore throat.   Eyes: Negative for visual disturbance.  Respiratory: Negative for cough and shortness of breath.   Cardiovascular: Negative for chest pain and leg swelling.  Gastrointestinal: Negative for nausea, vomiting and diarrhea.  Genitourinary: Negative for dysuria.  Musculoskeletal: Positive for myalgias and arthralgias.  Skin: Negative for rash.  Neurological: Negative for weakness, numbness and headaches.    Allergies  Vancomycin  Home Medications   Prior to Admission medications   Medication Sig Start Date End Date Taking? Authorizing Provider  aspirin  EC 81 MG tablet Take 81 mg by mouth daily.    Historical Provider, MD  ergocalciferol (DRISDOL) 50000 UNITS capsule Take 1 capsule (50,000 Units total) by mouth once a week. 11/05/14   Massie MaroonLachina M Hollis, FNP  folic acid (FOLVITE) 1 MG tablet Take 1 tablet (1 mg total) by mouth daily. 07/27/14   Massie MaroonLachina M Hollis, FNP  hydroxyurea (HYDREA) 500 MG capsule Take 2,000 mg by mouth daily. May take with food to minimize GI side effects.    Historical Provider, MD  loratadine (CLARITIN REDITABS) 10 MG dissolvable tablet Take 10 mg by mouth daily.    Historical Provider, MD  oxyCODONE-acetaminophen (PERCOCET) 5-325 MG per tablet Take 1 tablet by mouth every 4 (four) hours as needed for severe pain. 09/10/14   Massie MaroonLachina M Hollis, FNP   BP 157/83 mmHg  Pulse 103  Temp(Src) 98.6 F (37 C) (Oral)  Resp 18  SpO2 99%  LMP 11/01/2014 Physical Exam  Constitutional: She appears well-developed and well-nourished. No distress.  HENT:  Head: Normocephalic and atraumatic.  Mouth/Throat: Oropharynx is clear and moist. No oropharyngeal exudate.  Eyes: Conjunctivae are normal.  Neck: Neck supple. No thyromegaly present.  Cardiovascular: Normal rate, regular rhythm and intact distal pulses.   Pulmonary/Chest: Effort normal and breath sounds normal. No respiratory distress. She has no wheezes. She has no rales. She exhibits no tenderness.  Abdominal: Soft. There is no tenderness.  Musculoskeletal: She exhibits tenderness.       Right  elbow: She exhibits no swelling, no effusion and no deformity. Tenderness found.       Right knee: She exhibits no swelling, no effusion, no deformity and no erythema. Tenderness found.       Left knee: She exhibits no swelling, no ecchymosis, no deformity and no erythema. Tenderness found.       Arms:      Legs: Lymphadenopathy:    She has no cervical adenopathy.  Neurological: She is alert.  Skin: Skin is warm and dry. No rash noted. She is not diaphoretic.  Psychiatric: She has a  normal mood and affect.  Nursing note and vitals reviewed.   ED Course  Procedures (including critical care time) Labs Review Labs Reviewed  CBC WITH DIFFERENTIAL - Abnormal; Notable for the following:    Hemoglobin 8.8 (*)    HCT 27.9 (*)    MCV 62.6 (*)    MCH 19.7 (*)    RDW 20.5 (*)    Neutrophils Relative % 84 (*)    Lymphocytes Relative 9 (*)    Neutro Abs 7.8 (*)    All other components within normal limits  BASIC METABOLIC PANEL - Abnormal; Notable for the following:    Glucose, Bld 105 (*)    All other components within normal limits  RETICULOCYTES - Abnormal; Notable for the following:    Retic Ct Pct 7.1 (*)    Retic Count, Manual 316.7 (*)    All other components within normal limits  PREGNANCY, URINE    Imaging Review No results found.   EKG Interpretation None      MDM   Final diagnoses:  Sickle-cell disease with pain   19 yo with pain related to sickle cell disease with report of fever at home but afebrile in ED. Pain is in knees and right arm but denies chest or abd pain.   CBC, BMP, Reticulocytes, NS bolus, Dilaudid, Zofran.  Will medicate with 3 doses 15-30 min apart and re-eval.   Labs reviewed: hgb: 8.8,  retic ct pct: 7.1.  After 3 doses of dilaudid pt's pain is decreased from 10 to 7 and feels like she will need further treatment to accomplish pain control.  Consulted Dr. Ashley Royalty regarding pt's status, recommends [facility]. Discussed with pt who is in agreement.  Filed Vitals:   11/06/14 1021  BP: 157/83  Pulse: 103  Temp: 98.6 F (37 C)  TempSrc: Oral  Resp: 18  SpO2: 99%   Meds given in ED:  Medications  sodium chloride 0.9 % bolus 1,000 mL (0 mLs Intravenous Stopped 11/06/14 1229)  ondansetron (ZOFRAN) injection 4 mg (4 mg Intravenous Given 11/06/14 1109)  HYDROmorphone (DILAUDID) injection 1 mg (1 mg Intravenous Given 11/06/14 1142)  HYDROmorphone (DILAUDID) injection 2 mg (2 mg  Intravenous Given 11/06/14 1202)  ketorolac (TORADOL) 30 MG/ML injection 30 mg (30 mg Intravenous Given 11/06/14 1413)  HYDROmorphone (DILAUDID) injection 2 mg (2 mg Intravenous Given 11/06/14 1411)    New Prescriptions   No medications on file      Harle Battiest, NP 11/09/14 0011  Juliet Rude. Rubin Payor, MD 11/09/14 [phone]

## 2014-11-06 NOTE — Telephone Encounter (Signed)
Patient calling in stating pain crisis came back last night.  Patient states she has pain to knees bilateral and right arm.  Patient states she also have a fever of 101.4. Per patient she has been taking her pain medication as prescribed.  Last dose taken at 6:00a.m.  I advised I would notify the physician and give her a call back.  Patient verbalizes understanding.

## 2014-11-06 NOTE — Progress Notes (Addendum)
Patient ID: Ana Brooks, female   DOB: 1996/04/30, 19 y.o.   MRN: [id number] Discharge instructions given to patient, questions answered.  Prescription for Oxycodone given.  IV removed without difficulty.  Patient states pain has improved to 5/10 on pain scale.  Family at bedside.

## 2014-11-06 NOTE — ED Notes (Signed)
Sickle cell pain in knees with fever since Sunday

## 2014-11-06 NOTE — Discharge Summary (Signed)
Physician Discharge Summary  Ana Brooks WUJ:[id number] DOB: 05/15/1996 DOA: 11/06/2014  PCP: MATTHEWS,MICHELLE A., MD  Admit date: 11/06/2014 Discharge date: 11/06/2014  Discharge Diagnoses:  Active Problems:   Sickle cell crisis   Discharge Condition: Stable  Disposition: Home Follow-up Information    Follow up with MATTHEWS,MICHELLE A., MD.   Specialty:  Internal Medicine   Why:  the clinic directly from the emergency department   Contact information:   [address] [place] [address] [phone]       Diet: Regular  Wt Readings from Last 3 Encounters:  11/05/14 177 lb (80.287 kg)  11/01/14 177 lb (80.287 kg)  07/27/14 188 lb (85.276 kg)    History of present illness:  Pt with Hb SS well known to me presented to ED with c/o pain for several days localized to knees and arms. She also reported a fever of 101.4 at home however she was evaluated in the ED and was afebrile with no signs of infection. She was treated in the [facility] yesterday and discharged home as she felt that she could manage her pain at home. She attempted to manage with her oral medications but was unsuccessful in her attempts to control pain.She rates her pain as 9/10 and sharp and throbbing in nature. The pain is non-radiating and not associated with any other symptoms. He denies any SOB, dizziness, pre-syncope,chills N/V/D. She is admitted to the [facility] for management of early acute sickle cell crisis.  Hospital Course:  Pt was transferred to the Day Hospital After being evaluated in the ED due to her reports of fever in addition to the pain. She was found to be afebrile and without a source of infection. She was treated with Dilaudid via weight based PCA, IVF and Toradol . Prior to discharge she received a dose of Oxycodone 10 mg x 1.  Her pain decreased to 5/10 and patient felt that she could manage at home with a dose of Oxycodone 10 mg q 4 hours PRN. I personally feel that she  will require hospitalization as I do not feel that she will be able to manage at home. I have discussed the option of hospitalization and patient is requesting discharge. She is otherwise normal and I have no objective grounds on which to admit the patient so she is being discharged home. Pt's husband at bedside during discussion.  Discharge Exam:  Filed Vitals:   11/06/14 1708  BP:   Pulse: 85  Temp:   Resp: 14   Filed Vitals:   11/06/14 1433 11/06/14 1528 11/06/14 1630 11/06/14 1708  BP:  131/55 128/63   Pulse:  78 80 85  Temp:  98.5 F (36.9 C)    TempSrc:  Oral    Resp: SpO2: 96% 96% 99% 99%   General: Alert, awake, oriented x3, in no acute distress.  HEENT: [place]/AT PEERL, EOMI, anicteric Neck: Trachea midline,  no masses, no thyromegal,y no JVD, no carotid bruit OROPHARYNX:  Moist, No exudate/ erythema/lesions.  Heart: Regular rate and rhythm, without murmurs, rubs, gallops, PMI non-displaced, no heaves or thrills on palpation.  Lungs: Clear to auscultation, no wheezing or rhonchi noted. No increased vocal fremitus resonant to percussion  Abdomen: Soft, nontender, nondistended, positive bowel sounds, no masses no hepatosplenomegaly noted..  Neuro: No focal neurological deficits noted cranial nerves II through XII grossly intact. Strength normal in bilateral upper and lower extremities. Musculoskeletal: No warm swelling or erythema around  joints, no spinal tenderness noted. Psychiatric: Patient alert and oriented x3, good insight and cognition, good recent to remote recall.    Discharge Instructions  Discharge Instructions    Activity as tolerated - No restrictions    Complete by:  As directed      Diet general    Complete by:  As directed             Medication List    STOP taking these medications        oxyCODONE-acetaminophen 5-325 MG per tablet  Commonly known as:  PERCOCET      TAKE these medications        aspirin EC 81 MG tablet  Take 81  mg by mouth daily.     ergocalciferol 50000 UNITS capsule  Commonly known as:  DRISDOL  Take 1 capsule (50,000 Units total) by mouth once a week.     folic acid 1 MG tablet  Commonly known as:  FOLVITE  Take 1 tablet (1 mg total) by mouth daily.     hydroxyurea 500 MG capsule  Commonly known as:  HYDREA  Take 2,000 mg by mouth daily. May take with food to minimize GI side effects.     loratadine 10 MG dissolvable tablet  Commonly known as:  CLARITIN REDITABS  Take 10 mg by mouth daily as needed for allergies.     NEXPLANON 68 MG Impl implant  Generic drug:  etonogestrel  1 each by Subdermal route once.     Oxycodone HCl 10 MG Tabs  Take 1 tablet (10 mg total) by mouth every 4 (four) hours as needed.          The results of significant diagnostics from this hospitalization (including imaging, microbiology, ancillary and laboratory) are listed below for reference.    Significant Diagnostic Studies: No results found.  Microbiology: No results found for this or any previous visit (from the past 240 hour(s)).   Labs: Basic Metabolic Panel:  Recent Labs Lab 11/05/14 0920 11/06/14 1107  NA 136 136  K 3.6 3.7  CL 104 106  CO2 22 22  GLUCOSE 125* 105*  BUN 9 7  CREATININE 0.49* 0.52  CALCIUM 10.0 10.5   Liver Function Tests:  Recent Labs Lab 11/05/14 0920  AST 35  ALT 18  ALKPHOS 57  BILITOT 3.4*  PROT 7.6  ALBUMIN 4.7   No results for input(s): LIPASE, AMYLASE in the last 168 hours. No results for input(s): AMMONIA in the last 168 hours. CBC:  Recent Labs Lab 11/01/14 1441 11/05/14 0920 11/06/14 1107  WBC 5.4 12.5* 9.3  NEUTROABS 3.8 10.9* 7.8*  HGB 9.0* 8.9* 8.8*  HCT 28.0* 27.9* 27.9*  MCV 62.2* 62.7* 62.6*  PLT 291 217 193   Cardiac Enzymes: No results for input(s): CKTOTAL, CKMB, CKMBINDEX, TROPONINI in the last 168 hours. BNP: Invalid input(s): POCBNP CBG: No results for input(s): GLUCAP in the last 168 hours. Ferritin: No results  for input(s): FERRITIN in the last 168 hours.  Time coordinating discharge: 50 minutes  Signed:  MATTHEWS,MICHELLE A.  11/06/2014, 6:17 PM

## 2014-11-06 NOTE — H&P (Signed)
[facility] History and Physical  [place] Ana SaverM Brooks ZOX:[id number] DOB: 1995/12/31 DOA: 11/06/2014   PCP: MATTHEWS,MICHELLE A., MD   Chief Complaint: Pain in Knees for several days.  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  She also reported a fever of 101.4 at home however she was evaluated in the ED and was afebrile with no signs of infection. She was [facility] and discharged home as she felt that she could manage her pain at home. She attempted to manage with her oral medications but was unsuccessful in her attempts to control pain.She rates her pain as 9/10 and sharp and throbbing in nature. The pain is non-radiating and not associated with any other symptoms. He denies any SOB, dizziness, pre-syncope,chills N/V/D. [facility] is [facility] for management of early acute sickle cell crisis.      Review of Systems:  Constitutional: No weight loss, night sweats, Fevers, chills, fatigue.  HEENT: No headaches, dizziness, seizures, vision changes, difficulty swallowing,Tooth/dental problems,Sore throat, No sneezing, itching, ear ache, nasal congestion, post nasal drip,  Cardio-vascular: No chest pain, Orthopnea, PND, swelling in lower extremities, anasarca, dizziness, palpitations  GI: No heartburn, indigestion, abdominal pain, nausea, vomiting, diarrhea, change in bowel habits, loss of appetite  Resp: No shortness of breath with exertion or at rest. No excess mucus, no productive cough, No non-productive cough, No coughing up of blood.No change in color of mucus.No wheezing.No chest wall deformity  Skin: no rash or lesions.  GU: no dysuria, change in color of urine, no urgency or frequency. No flank pain.  Psych: No change in mood or affect. No depression or anxiety. No memory loss.    Past Medical History  Diagnosis Date  . Sickle cell anemia   . Allergy   .  Diabetes mellitus without complication   . COPD (chronic obstructive pulmonary disease)   . Hypertension    Past Surgical History  Procedure Laterality Date  . Mouth surgery    . Cholecystectomy     Social History:  reports that she has never smoked. She does not have any smokeless tobacco history on file. She reports that she does not drink alcohol or use illicit drugs.  Allergies  Allergen Reactions  . Vancomycin Hives    Family History  Problem Relation Age of Onset  . Adopted: Yes    Prior to Admission medications   Medication Sig Start Date End Date Taking? Authorizing Provider  aspirin EC 81 MG tablet Take 81 mg by mouth daily.   Yes Historical Provider, MD  ergocalciferol (DRISDOL) 50000 UNITS capsule Take 1 capsule (50,000 Units total) by mouth once a week. 11/05/14  Yes Massie MaroonLachina Ana Hollis, FNP  etonogestrel (NEXPLANON) 68 MG IMPL implant 1 each by Subdermal route once.   Yes Historical Provider, MD  folic acid (FOLVITE) 1 MG tablet Take 1 tablet (1 mg total) by mouth daily. 07/27/14  Yes Massie MaroonLachina Ana Hollis, FNP  hydroxyurea (HYDREA) 500 MG capsule Take 2,000 mg by mouth daily. May take with food to minimize GI side effects.   Yes Historical Provider, MD  loratadine (CLARITIN REDITABS) 10 MG dissolvable tablet Take 10 mg by mouth daily as needed for allergies.    Yes Historical Provider, MD  oxyCODONE-acetaminophen (PERCOCET) 5-325 MG per tablet Take 1 tablet by mouth every 4 (four) hours as needed for severe pain. 09/10/14  Yes Massie Maroon, FNP   Physical Exam: Filed Vitals:   11/06/14 1200 11/06/14 1230 11/06/14 1233 11/06/14 1300  BP: 149/73 135/74 129/66 144/72  Pulse: 90 89 86   Temp:      TempSrc:      Resp:   16   SpO2: 98% 96% 97%    General: Alert, awake, oriented x3, in no acute distress.  HEENT: [place]/AT PEERL, EOMI, anicteric Neck: Trachea midline,  no masses, no thyromegal,y no JVD, no carotid bruit OROPHARYNX:  Moist, No exudate/ erythema/lesions.  Heart:  Regular rate and rhythm, without murmurs, rubs, gallops, PMI non-displaced, no heaves or thrills on palpation.  Lungs: Clear to auscultation, no wheezing or rhonchi noted. No increased vocal fremitus resonant to percussion  Abdomen: Soft, nontender, nondistended, positive bowel sounds, no masses no hepatosplenomegaly noted..  Neuro: No focal neurological deficits noted cranial nerves II through XII grossly intact.   Labs on Admission:   Basic Metabolic Panel:  Recent Labs Lab 11/05/14 0920 11/06/14 1107  NA 136 136  K 3.6 3.7  CL 104 106  CO2 22 22  GLUCOSE 125* 105*  BUN 9 7  CREATININE 0.49* 0.52  CALCIUM 10.0 10.5   Liver Function Tests:  Recent Labs Lab 11/05/14 0920  AST 35  ALT 18  ALKPHOS 57  BILITOT 3.4*  PROT 7.6  ALBUMIN 4.7   CBC:  Recent Labs Lab 11/01/14 1441 11/05/14 0920 11/06/14 1107  WBC 5.4 12.5* 9.3  NEUTROABS 3.8 10.9* 7.8*  HGB 9.0* 8.9* 8.8*  HCT 28.0* 27.9* 27.9*  MCV 62.2* 62.7* 62.6*  PLT 291 217 193    Assessment/Plan: Active Problems: Hb SS with Crisis: Pt will be treated a weight based Dilaudid PCA, IVF and Toradol . Patient will be re-evaluated for pain in the context of function and relationship to baseline as care progresses.   Time spend: 45 minutes Code Status: Full Code Family Communication: N/A Disposition Plan: Not yet determined.  MATTHEWS,MICHELLE A., MD  Pager [phone]  If 7PM-7AM, please contact night-coverage [email or web address] Password [email or web address] 11/06/2014, 1:59 PM

## 2014-11-06 NOTE — Discharge Instructions (Signed)
Sickle Cell Anemia °Sickle cell anemia is a condition where your red blood cells are shaped like sickles. Red blood cells carry oxygen through the body. Sickle-shaped red blood cells do not live as long as normal red blood cells. They also clump together and block blood from flowing through the blood vessels. These things prevent the body from getting enough oxygen. Sickle cell anemia causes organ damage and pain. It also increases the risk of infection. °HOME CARE °· Drink enough fluid to keep your pee (urine) clear or pale yellow. Drink more in hot weather and during exercise. °· Do not smoke. Smoking lowers oxygen levels in the blood. °· Only take over-the-counter or prescription medicines as told by your doctor. °· Take antibiotic medicines as told by your doctor. Make sure you finish them even if you start to feel better. °· Take supplements as told by your doctor. °· Consider wearing a medical alert bracelet. This tells anyone caring for you in an emergency of your condition. °· When traveling, keep your medical information, doctors' names, and the medicines you take with you at all times. °· If you have a fever, do not take fever medicines right away. This could cover up a problem. Tell your doctor. °·  Keep all follow-up visits with your doctor. Sickle cell anemia requires regular medical care. °GET HELP IF: °You have a fever. °GET HELP RIGHT AWAY IF: °· You feel dizzy or faint. °· You have new belly (abdominal) pain, especially on the left side near the stomach area. °· You have a lasting, often uncomfortable and painful erection of the penis (priapism). If it is not treated right away, you will become unable to have sex (impotence). °· You have numbness in your arms or legs or you have a hard time moving them. °· You have a hard time talking. °· You have a fever or lasting symptoms for more than 2-3 days. °· You have a fever and your symptoms suddenly get worse. °· You have signs or symptoms of infection.  These include: °¨ Chills. °¨ Being more tired than normal (lethargy). °¨ Irritability. °¨ Poor eating. °¨ Throwing up (vomiting). °· You have pain that is not helped with medicine. °· You have shortness of breath. °· You have pain in your chest. °· You are coughing up pus-like or bloody mucus. °· You have a stiff neck. °· Your feet or hands swell or have pain. °· Your belly looks bloated. °· Your joints hurt. °MAKE SURE YOU: °· Understand these instructions. °· Will watch your condition. °· Will get help right away if you are not doing well or get worse. °Document Released: 08/09/2013 Document Revised: 03/05/2014 Document Reviewed: 08/09/2013 °Exit[employer]® Patient Information ©2015 [employer]. This information is not intended to replace advice given to you by your health care provider. Make sure you discuss any questions you have with your health care provider. ° °

## 2014-11-07 ENCOUNTER — Inpatient Hospital Stay (HOSPITAL_COMMUNITY)
Admission: EM | Admit: 2014-11-07 | Discharge: 2014-11-11 | DRG: 811 | Disposition: A | Discharge status: Home / Self Care | Payer: Medicaid Other | Attending: Internal Medicine | Admitting: Internal Medicine

## 2014-11-07 ENCOUNTER — Emergency Department (HOSPITAL_COMMUNITY): Payer: Medicaid Other

## 2014-11-07 ENCOUNTER — Encounter (HOSPITAL_COMMUNITY): Payer: Self-pay

## 2014-11-07 DIAGNOSIS — E871 Hypo-osmolality and hyponatremia: Secondary | ICD-10-CM | POA: Diagnosis present

## 2014-11-07 DIAGNOSIS — Z79899 Other long term (current) drug therapy: Secondary | ICD-10-CM

## 2014-11-07 DIAGNOSIS — I10 Essential (primary) hypertension: Secondary | ICD-10-CM | POA: Diagnosis present

## 2014-11-07 DIAGNOSIS — Z7982 Long term (current) use of aspirin: Secondary | ICD-10-CM | POA: Diagnosis not present

## 2014-11-07 DIAGNOSIS — Y95 Nosocomial condition: Secondary | ICD-10-CM | POA: Diagnosis present

## 2014-11-07 DIAGNOSIS — Z881 Allergy status to other antibiotic agents status: Secondary | ICD-10-CM

## 2014-11-07 DIAGNOSIS — E119 Type 2 diabetes mellitus without complications: Secondary | ICD-10-CM | POA: Diagnosis present

## 2014-11-07 DIAGNOSIS — J449 Chronic obstructive pulmonary disease, unspecified: Secondary | ICD-10-CM | POA: Diagnosis present

## 2014-11-07 DIAGNOSIS — D5700 Hb-SS disease with crisis, unspecified: Principal | ICD-10-CM | POA: Diagnosis present

## 2014-11-07 DIAGNOSIS — E876 Hypokalemia: Secondary | ICD-10-CM | POA: Diagnosis present

## 2014-11-07 DIAGNOSIS — Z79891 Long term (current) use of opiate analgesic: Secondary | ICD-10-CM

## 2014-11-07 DIAGNOSIS — J189 Pneumonia, unspecified organism: Secondary | ICD-10-CM | POA: Diagnosis present

## 2014-11-07 DIAGNOSIS — M79601 Pain in right arm: Secondary | ICD-10-CM | POA: Diagnosis not present

## 2014-11-07 DIAGNOSIS — R509 Fever, unspecified: Secondary | ICD-10-CM

## 2014-11-07 LAB — RETICULOCYTES
RBC.: 4.33 MIL/uL (ref 3.87–5.11)
Retic Count, Absolute: 324.8 10*3/uL — ABNORMAL HIGH (ref 19.0–186.0)
Retic Ct Pct: 7.5 % — ABNORMAL HIGH (ref 0.4–3.1)

## 2014-11-07 LAB — LACTATE DEHYDROGENASE: LDH: 425 U/L — ABNORMAL HIGH (ref 94–250)

## 2014-11-07 LAB — CBC WITH DIFFERENTIAL/PLATELET
Basophils Absolute: 0 10*3/uL (ref 0.0–0.1)
Basophils Relative: 0 % (ref 0–1)
EOS ABS: 0 10*3/uL (ref 0.0–0.7)
Eosinophils Relative: 0 % (ref 0–5)
HCT: 27 % — ABNORMAL LOW (ref 36.0–46.0)
Hemoglobin: 8.5 g/dL — ABNORMAL LOW (ref 12.0–15.0)
LYMPHS ABS: 1.1 10*3/uL (ref 0.7–4.0)
Lymphocytes Relative: 12 % (ref 12–46)
MCH: 19.6 pg — ABNORMAL LOW (ref 26.0–34.0)
MCHC: 31.5 g/dL (ref 30.0–36.0)
MCV: 62.4 fL — ABNORMAL LOW (ref 78.0–100.0)
MONO ABS: 1 10*3/uL (ref 0.1–1.0)
Monocytes Relative: 11 % (ref 3–12)
NEUTROS ABS: 7.4 10*3/uL (ref 1.7–7.7)
Neutrophils Relative %: 77 % (ref 43–77)
PLATELETS: 147 10*3/uL — AB (ref 150–400)
RBC: 4.33 MIL/uL (ref 3.87–5.11)
RDW: 20.1 % — ABNORMAL HIGH (ref 11.5–15.5)
WBC: 9.5 10*3/uL (ref 4.0–10.5)

## 2014-11-07 LAB — URINALYSIS, ROUTINE W REFLEX MICROSCOPIC
Bilirubin Urine: NEGATIVE
GLUCOSE, UA: NEGATIVE mg/dL
Hgb urine dipstick: NEGATIVE
Ketones, ur: NEGATIVE mg/dL
NITRITE: NEGATIVE
Protein, ur: NEGATIVE mg/dL
Specific Gravity, Urine: 1.009 (ref 1.005–1.030)
Urobilinogen, UA: >8 mg/dL — ABNORMAL HIGH (ref 0.0–1.0)
pH: 7 (ref 5.0–8.0)

## 2014-11-07 LAB — COMPREHENSIVE METABOLIC PANEL
ALK PHOS: 80 U/L (ref 39–117)
ALT: 16 U/L (ref 0–35)
ANION GAP: 6 (ref 5–15)
AST: 24 U/L (ref 0–37)
Albumin: 4.7 g/dL (ref 3.5–5.2)
BILIRUBIN TOTAL: 4.1 mg/dL — AB (ref 0.3–1.2)
BUN: 7 mg/dL (ref 6–23)
CALCIUM: 10.1 mg/dL (ref 8.4–10.5)
CO2: 24 mmol/L (ref 19–32)
Chloride: 101 mEq/L (ref 96–112)
Creatinine, Ser: 0.49 mg/dL — ABNORMAL LOW (ref 0.50–1.10)
GFR calc Af Amer: >90 mL/min (ref >90)
GFR calc non Af Amer: >90 mL/min (ref >90)
Glucose, Bld: 100 mg/dL — ABNORMAL HIGH (ref 70–99)
POTASSIUM: 3.3 mmol/L — AB (ref 3.5–5.1)
Sodium: 131 mmol/L — ABNORMAL LOW (ref 135–145)
TOTAL PROTEIN: 7.8 g/dL (ref 6.0–8.3)

## 2014-11-07 LAB — LACTIC ACID, PLASMA: Lactic Acid, Venous: 1 mmol/L (ref 0.5–2.2)

## 2014-11-07 LAB — URINE MICROSCOPIC-ADD ON

## 2014-11-07 LAB — MAGNESIUM: Magnesium: 1.6 mg/dL (ref 1.5–2.5)

## 2014-11-07 MED ORDER — LORATADINE 10 MG PO TABS
10.0000 mg | ORAL_TABLET | Freq: Every day | ORAL | Status: DC | PRN
  Filled 2014-11-07: qty 1

## 2014-11-07 MED ORDER — POTASSIUM CHLORIDE CRYS ER 20 MEQ PO TBCR
40.0000 meq | EXTENDED_RELEASE_TABLET | Freq: Once | ORAL | Status: AC
  Administered 2014-11-07: 40 meq via ORAL
  Filled 2014-11-07: qty 2

## 2014-11-07 MED ORDER — HYDROMORPHONE HCL 2 MG/ML IJ SOLN
2.0000 mg | Freq: Once | INTRAMUSCULAR | Status: AC
  Administered 2014-11-07: 2 mg via INTRAVENOUS
  Filled 2014-11-07: qty 1

## 2014-11-07 MED ORDER — PIPERACILLIN-TAZOBACTAM 3.375 G IVPB 30 MIN
3.3750 g | Freq: Once | INTRAVENOUS | Status: AC
  Administered 2014-11-07: 3.375 g via INTRAVENOUS
  Filled 2014-11-07: qty 50

## 2014-11-07 MED ORDER — VITAMIN D (ERGOCALCIFEROL) 1.25 MG (50000 UNIT) PO CAPS: 50000.0000 [IU] | ORAL_CAPSULE | ORAL | Status: DC

## 2014-11-07 MED ORDER — HYDROXYUREA 500 MG PO CAPS
2000.0000 mg | ORAL_CAPSULE | Freq: Every day | ORAL | Status: DC
  Administered 2014-11-08 – 2014-11-11 (×4): 2000 mg via ORAL
  Filled 2014-11-07 (×4): qty 4

## 2014-11-07 MED ORDER — HYDROMORPHONE 0.3 MG/ML IV SOLN
INTRAVENOUS | Status: DC
  Administered 2014-11-08: 2.89 mg via INTRAVENOUS
  Administered 2014-11-08: 0.2 mg via INTRAVENOUS
  Administered 2014-11-08: 14:00:00 via INTRAVENOUS
  Administered 2014-11-08: 2 mg via INTRAVENOUS
  Administered 2014-11-08: via INTRAVENOUS
  Filled 2014-11-07 (×3): qty 25

## 2014-11-07 MED ORDER — NALOXONE HCL 0.4 MG/ML IJ SOLN: 0.4000 mg | INTRAMUSCULAR | Status: DC | PRN

## 2014-11-07 MED ORDER — DEXTROSE-NACL 5-0.45 % IV SOLN
INTRAVENOUS | Status: DC
  Administered 2014-11-08 – 2014-11-11 (×8): via INTRAVENOUS

## 2014-11-07 MED ORDER — SODIUM CHLORIDE 0.9 % IJ SOLN: 9.0000 mL | INTRAMUSCULAR | Status: DC | PRN

## 2014-11-07 MED ORDER — CLINDAMYCIN PHOSPHATE 600 MG/50ML IV SOLN
600.0000 mg | Freq: Three times a day (TID) | INTRAVENOUS | Status: DC
  Administered 2014-11-08 – 2014-11-09 (×4): 600 mg via INTRAVENOUS
  Filled 2014-11-07 (×5): qty 50

## 2014-11-07 MED ORDER — SENNOSIDES-DOCUSATE SODIUM 8.6-50 MG PO TABS
1.0000 | ORAL_TABLET | Freq: Two times a day (BID) | ORAL | Status: DC
  Administered 2014-11-07 – 2014-11-09 (×5): 1 via ORAL
  Filled 2014-11-07 (×10): qty 1

## 2014-11-07 MED ORDER — HYDROMORPHONE HCL 1 MG/ML IJ SOLN
2.0000 mg | Freq: Once | INTRAMUSCULAR | Status: AC
Start: 2014-11-07 — End: 2014-11-07
  Administered 2014-11-07: 2 mg via INTRAVENOUS
  Filled 2014-11-07: qty 2

## 2014-11-07 MED ORDER — SODIUM CHLORIDE 0.9 % IV BOLUS (SEPSIS)
500.0000 mL | Freq: Once | INTRAVENOUS | Status: AC
  Administered 2014-11-07: 500 mL via INTRAVENOUS

## 2014-11-07 MED ORDER — POLYETHYLENE GLYCOL 3350 17 G PO PACK
17.0000 g | PACK | Freq: Every day | ORAL | Status: DC | PRN
  Filled 2014-11-07: qty 1

## 2014-11-07 MED ORDER — HYDROMORPHONE HCL 1 MG/ML IJ SOLN: 1.0000 mg | Freq: Once | INTRAMUSCULAR | Status: DC

## 2014-11-07 MED ORDER — FOLIC ACID 1 MG PO TABS
1.0000 mg | ORAL_TABLET | Freq: Every day | ORAL | Status: DC
  Administered 2014-11-08 – 2014-11-11 (×4): 1 mg via ORAL
  Filled 2014-11-07 (×4): qty 1

## 2014-11-07 MED ORDER — CLINDAMYCIN PHOSPHATE 900 MG/50ML IV SOLN
900.0000 mg | Freq: Once | INTRAVENOUS | Status: AC
  Administered 2014-11-07: 900 mg via INTRAVENOUS
  Filled 2014-11-07: qty 50

## 2014-11-07 MED ORDER — ASPIRIN EC 81 MG PO TBEC
81.0000 mg | DELAYED_RELEASE_TABLET | Freq: Every day | ORAL | Status: DC
  Administered 2014-11-08 – 2014-11-11 (×4): 81 mg via ORAL
  Filled 2014-11-07 (×4): qty 1

## 2014-11-07 MED ORDER — HYDROMORPHONE HCL 2 MG/ML IJ SOLN
2.0000 mg | INTRAMUSCULAR | Status: AC | PRN
Start: 2014-11-07 — End: 2014-11-08
  Administered 2014-11-07 – 2014-11-08 (×2): 2 mg via INTRAVENOUS
  Filled 2014-11-07 (×2): qty 1

## 2014-11-07 MED ORDER — ALBUTEROL SULFATE (2.5 MG/3ML) 0.083% IN NEBU
2.5000 mg | INHALATION_SOLUTION | Freq: Four times a day (QID) | RESPIRATORY_TRACT | Status: DC
  Administered 2014-11-08: 2.5 mg via RESPIRATORY_TRACT
  Filled 2014-11-07: qty 3

## 2014-11-07 MED ORDER — SODIUM CHLORIDE 0.9 % IV SOLN
INTRAVENOUS | Status: AC
  Administered 2014-11-07: 22:00:00 via INTRAVENOUS

## 2014-11-07 MED ORDER — ENOXAPARIN SODIUM 40 MG/0.4ML ~~LOC~~ SOLN
40.0000 mg | Freq: Every day | SUBCUTANEOUS | Status: DC
  Filled 2014-11-07 (×5): qty 0.4

## 2014-11-07 MED ORDER — DIPHENHYDRAMINE HCL 50 MG/ML IJ SOLN
12.5000 mg | Freq: Four times a day (QID) | INTRAMUSCULAR | Status: DC | PRN
  Administered 2014-11-08: 12.5 mg via INTRAVENOUS
  Filled 2014-11-07: qty 1

## 2014-11-07 MED ORDER — DIPHENHYDRAMINE HCL 12.5 MG/5ML PO ELIX: 12.5000 mg | ORAL_SOLUTION | Freq: Four times a day (QID) | ORAL | Status: DC | PRN

## 2014-11-07 NOTE — H&P (Signed)
[facility] Admission History and Physical       Ana Brooks ZOX:[id number] DOB: 1996/03/15 DOA: 11/07/2014  Referring physician: EDP PCP: MATTHEWS,MICHELLE A., MD  Specialists:   Chief Complaint: Increased Pain in Right Arm and Left Leg  HPI: Ana Brooks is a 19 y.o. female with Sickle Cell SS Disease who presents to the ED with complaints of increased pain in her right arm and left Leg  X 4 days.  She has not had relief with her home pain med regimen.  [facility] for her symptoms.  The pain temporarily improved but returned.   She has also had fevers and chills but denies cough or dysuria.   In the ED she was found to have opacities of the middle and LUL.  She wasp laced on IV Clindamycin and Zosyn  To cover an HCAP since she is allergic to Vancomycin.     Review of Systems:  Constitutional: No Weight Loss, No Weight Gain, Night Sweats, +Fevers, Chills, Dizziness, Fatigue, or Generalized Weakness HEENT: No Headaches, Difficulty Swallowing,Tooth/Dental Problems,Sore Throat,  No Sneezing, Rhinitis, Ear Ache, Nasal Congestion, or Post Nasal Drip,  Cardio-vascular:  No Chest pain, Orthopnea, PND, Edema in Lower Extremities, Anasarca, Dizziness, Palpitations  Resp: No Dyspnea, No DOE, No Productive Cough, No Non-Productive Cough, No Hemoptysis, No Wheezing.    GI: No Heartburn, Indigestion, Abdominal Pain, Nausea, Vomiting, Diarrhea, Hematemesis, Hematochezia, Melena, Change in Bowel Habits,  Loss of Appetite  GU: No Dysuria, Change in Color of Urine, No Urgency or Frequency, No Flank pain.  Musculoskeletal:  +Left LEG and Right Arm Pain, No Decreased Range of Motion, No Back Pain.  Neurologic: No Syncope, No Seizures, Muscle Weakness, Paresthesia, Vision Disturbance or Loss, No Diplopia, No Vertigo, No Difficulty Walking,  Skin: No Rash or Lesions. Psych: No Change in Mood or Affect, No Depression or Anxiety, No Memory  loss, No Confusion, or Hallucinations   Past Medical History  Diagnosis Date  . Sickle cell anemia   . Allergy   . Diabetes mellitus without complication   . COPD (chronic obstructive pulmonary disease)   . Hypertension       Past Surgical History  Procedure Laterality Date  . Mouth surgery    . Cholecystectomy         Prior to Admission medications   Medication Sig Start Date End Date Taking? Authorizing Provider  aspirin EC 81 MG tablet Take 81 mg by mouth daily.   Yes Historical Provider, MD  ergocalciferol (DRISDOL) 50000 UNITS capsule Take 1 capsule (50,000 Units total) by mouth once a week. 11/05/14  Yes Massie Maroon, FNP  etonogestrel (NEXPLANON) 68 MG IMPL implant 1 each by Subdermal route once.   Yes Historical Provider, MD  folic acid (FOLVITE) 1 MG tablet Take 1 tablet (1 mg total) by mouth daily. 07/27/14  Yes Massie Maroon, FNP  hydroxyurea (HYDREA) 500 MG capsule Take 4 capsules (2,000 mg total) by mouth daily. May take with food to minimize GI side effects. 11/06/14  Yes Massie Maroon, FNP  loratadine (CLARITIN REDITABS) 10 MG dissolvable tablet Take 10 mg by mouth daily as needed for allergies.    Yes Historical Provider, MD  Oxycodone HCl 10 MG TABS Take 1 tablet (10 mg total) by mouth every 4 (four) hours as needed. 11/06/14  Yes Altha Harm, MD      Allergies  Allergen Reactions  . Vancomycin Hives  Social History:  reports that she has never smoked. She does not have any smokeless tobacco history on file. She reports that she does not drink alcohol or use illicit drugs.     Family History  Problem Relation Age of Onset  . Adopted: Yes       Physical Exam:  GEN:  Pleasant Well Nourished and Well Developed 19 y.o. female  examined  and in no acute distress; cooperative with exam Filed Vitals:   11/07/14 1932 11/07/14 2156 11/07/14 2332  BP: 157/90 150/86 142/82  Pulse: 120 99 100  Temp: 101.7 F (38.7 C) 98.8 F (37.1 C) 99.5  F (37.5 C)  TempSrc: Oral Oral Oral  Resp: Height:  (1.753 m)   (1.753 m)  Weight:   83.6 kg (184 lb 4.9 oz)  SpO2: 98% 100% 100%   Blood pressure 142/82, pulse 100, temperature 99.5 F (37.5 C), temperature source Oral, resp. rate 16, height  (1.753 m), weight 83.6 kg (184 lb 4.9 oz), last menstrual period 11/01/2014, SpO2 100 %. PSYCH: She is alert and oriented x4; does not appear anxious does not appear depressed; affect is normal HEENT: Normocephalic and Atraumatic, Mucous membranes pink; PERRLA; EOM intact; Fundi:  Benign;  No scleral icterus, Nares: Patent, Oropharynx: Clear, Fair Dentition,    Neck:  FROM, No Cervical Lymphadenopathy nor Thyromegaly or Carotid Bruit; No JVD; Breasts:: Not examined CHEST WALL: No tenderness CHEST: Normal respiration, clear to auscultation bilaterally HEART: Regular rate and rhythm; no murmurs rubs or gallops BACK: No kyphosis or scoliosis; No CVA tenderness ABDOMEN: Positive Bowel Sounds, Soft Non-Tender; No Masses, No Organomegaly, No Pannus; No Intertriginous candida. Rectal Exam: Not done EXTREMITIES: No Cyanosis, Clubbing, or Edema; No Ulcerations. Genitalia: not examined PULSES: 2+ and symmetric SKIN: Normal hydration no rash or ulceration CNS:  Ax O x 4, No Focal Deficits Vascular: pulses palpable throughout    Labs on Admission:  Basic Metabolic Panel:  Recent Labs Lab 11/05/14 0920 11/06/14 1107 11/07/14 2012  NA 136 136 131*  K 3.6 3.7 3.3*  CL 104 106 101  CO2 GLUCOSE 125* 105* 100*  BUN CREATININE 0.49* 0.52 0.49*  CALCIUM 10.0 10.5 10.1   Liver Function Tests:  Recent Labs Lab 11/05/14 0920 11/07/14 2012  AST 35 24  ALT 18 16  ALKPHOS 57 80  BILITOT 3.4* 4.1*  PROT 7.6 7.8  ALBUMIN 4.7 4.7   No results for input(s): LIPASE, AMYLASE in the last 168 hours. No results for input(s): AMMONIA in the last 168 hours. CBC:  Recent Labs Lab 11/01/14 1441 11/05/14 0920  11/06/14 1107 11/07/14 2012  WBC 5.4 12.5* 9.3 9.5  NEUTROABS 3.8 10.9* 7.8* 7.4  HGB 9.0* 8.9* 8.8* 8.5*  HCT 28.0* 27.9* 27.9* 27.0*  MCV 62.2* 62.7* 62.6* 62.4*  PLT 291 217 193 147*   Cardiac Enzymes: No results for input(s): CKTOTAL, CKMB, CKMBINDEX, TROPONINI in the last 168 hours.  BNP (last 3 results) No results for input(s): PROBNP in the last 8760 hours. CBG: No results for input(s): GLUCAP in the last 168 hours.  Radiological Exams on Admission: Dg Chest 2 View  11/07/2014   CLINICAL DATA:  Fever.  Pain, sickle cell pain for 3 days.  EXAM: CHEST  2 VIEW  COMPARISON:  03/05/2013  FINDINGS: Cardiomediastinal contours are normal, the heart is normal in size. Pulmonary vasculature is normal. There are ill-defined linear opacities in the  right middle and left upper lobes. No pleural effusion or pneumothorax. No acute osseous abnormality is seen.  IMPRESSION: Ill-defined linear opacities in the middle and left upper lobes. This may reflect atelectasis versus early pneumonia.   Electronically Signed   By: Rubye OaksMelanie  Ehinger M.D.   On: 11/07/2014 20:42      Assessment/Plan:   19 y.o. female with   Principal Problem:   1.    Sickle cell crisis   PCA Dilaudid for Pain  Active Problems:   2.   HCAP (healthcare-associated pneumonia)/Fever   IV Clindamycin     3.   Hypokalemia   Replete K+   Check Magnesium    4.   Hypertension   Monitor BPs     5.  DVT Prophylaxis   Lovenox      Code Status:   FULL CODE Family Communication:    No Family Present Disposition Plan:   Inpatient      Time spent:  6460 Minutes  Ron ParkerJENKINS,Shaney Deckman C [facility] Pager [phone]   If 7AM -7PM Please Contact the Day Rounding Team MD for [facility]  If 7PM-7AM, Please Contact Night-Floor Coverage  [email or web address] Password [email or web address] 11/07/2014, 11:38 PM     ADDENDUM:  Patient was seen and examined on 11/07/2014

## 2014-11-07 NOTE — ED Notes (Signed)
Patient transported to X-ray 

## 2014-11-07 NOTE — ED Notes (Signed)
Pt complains of sickle cell pain in her right arm and left leg since Sunday, home meds not relieving the pain.

## 2014-11-07 NOTE — ED Provider Notes (Signed)
CSN: [id number]     Arrival date & time 11/07/14  1907 History   First MD Initiated Contact with Patient 11/07/14 1951     Chief Complaint  Patient presents with  . Sickle Cell Pain Crisis     (Consider location/radiation/quality/duration/timing/severity/associated sxs/prior Treatment) Patient is a 19 y.o. female presenting with sickle cell pain. The history is provided by the patient.  Sickle Cell Pain Crisis Associated symptoms: no chest pain, no cough, no fever, no headaches, no shortness of breath, no sore throat and no vomiting   pt w hx sickle cell disease, c/o pain in bilateral legs, L > R, and bil arms R>L, for the past 3-4 days. Pain constant, dull, mod-sever, and similar to her prior sickle cell pain. States during the past few days intermittent fevers to 101 - pt states unsure of where fever coming from. Pt denies specific joint pain, redness or swelling. No cough, sore throat, runny nose, sinus pressure, headache or other uri c/o. No chest pain or sob. No abd pain. No nvd. No dysuria or gu c/o. No rash/skin lesions.  Was seen in ED and at [facility] clinic previously for the above symptoms within the past few days.      Past Medical History  Diagnosis Date  . Sickle cell anemia   . Allergy   . Diabetes mellitus without complication   . COPD (chronic obstructive pulmonary disease)   . Hypertension    Past Surgical History  Procedure Laterality Date  . Mouth surgery    . Cholecystectomy     Family History  Problem Relation Age of Onset  . Adopted: Yes   History  Substance Use Topics  . Smoking status: Never Smoker   . Smokeless tobacco: Not on file  . Alcohol Use: No   OB History    No data available     Review of Systems  Constitutional: Negative for fever and chills.  HENT: Negative for sinus pressure and sore throat.   Eyes: Negative for discharge and redness.  Respiratory: Negative for cough and shortness of breath.   Cardiovascular: Negative for chest  pain and leg swelling.  Gastrointestinal: Negative for vomiting, abdominal pain and diarrhea.  Endocrine: Negative for polyuria.  Genitourinary: Negative for dysuria and flank pain.  Musculoskeletal: Negative for back pain and neck pain.  Skin: Negative for rash.  Neurological: Negative for headaches.  Hematological: Does not bruise/bleed easily.  Psychiatric/Behavioral: Negative for confusion.      Allergies  Vancomycin  Home Medications   Prior to Admission medications   Medication Sig Start Date End Date Taking? Authorizing Provider  aspirin EC 81 MG tablet Take 81 mg by mouth daily.    Historical Provider, MD  ergocalciferol (DRISDOL) 50000 UNITS capsule Take 1 capsule (50,000 Units total) by mouth once a week. 11/05/14   Massie MaroonLachina M Hollis, FNP  etonogestrel (NEXPLANON) 68 MG IMPL implant 1 each by Subdermal route once.    Historical Provider, MD  folic acid (FOLVITE) 1 MG tablet Take 1 tablet (1 mg total) by mouth daily. 07/27/14   Massie MaroonLachina M Hollis, FNP  hydroxyurea (HYDREA) 500 MG capsule Take 4 capsules (2,000 mg total) by mouth daily. May take with food to minimize GI side effects. 11/06/14   Massie MaroonLachina M Hollis, FNP  loratadine (CLARITIN REDITABS) 10 MG dissolvable tablet Take 10 mg by mouth daily as needed for allergies.     Historical Provider, MD  Oxycodone HCl 10 MG TABS Take 1 tablet (10 mg total)  by mouth every 4 (four) hours as needed. 11/06/14   Altha Harm, MD   BP 157/90 mmHg  Pulse 120  Temp(Src) 101.7 F (38.7 C) (Oral)  Resp 15  Ht  (1.753 m)  SpO2 98%  LMP 11/01/2014 Physical Exam  Constitutional: She is oriented to person, place, and time. She appears well-developed and well-nourished. No distress.  HENT:  Nose: Nose normal.  Mouth/Throat: Oropharynx is clear and moist.  Eyes: Conjunctivae are normal. No scleral icterus.  Neck: Neck supple. No tracheal deviation present.  No stiffness or rigidity  Cardiovascular: Normal rate, regular rhythm,  normal heart sounds and intact distal pulses.  Exam reveals no gallop and no friction rub.   No murmur heard. Pulmonary/Chest: Effort normal and breath sounds normal. No respiratory distress.  Abdominal: Soft. Normal appearance and bowel sounds are normal. She exhibits no distension and no mass. There is no tenderness. There is no rebound and no guarding.  Genitourinary:  No cva tenderness  Musculoskeletal: She exhibits no edema or tenderness.  Lymphadenopathy:    She has no cervical adenopathy.  Neurological: She is alert and oriented to person, place, and time.  Skin: Skin is warm and dry. No rash noted. She is not diaphoretic.  No rash or skin lesions, no petechia.   Psychiatric: She has a normal mood and affect.  Nursing note and vitals reviewed.   ED Course  Procedures (including critical care time) Labs Review  Results for orders placed or performed during the hospital encounter of 11/07/14  CBC WITH DIFFERENTIAL  Result Value Ref Range   WBC 9.5 4.0 - 10.5 K/uL   RBC 4.33 3.87 - 5.11 MIL/uL   Hemoglobin 8.5 (L) 12.0 - 15.0 g/dL   HCT 45.4 (L) 09.8 - 11.9 %   MCV 62.4 (L) 78.0 - 100.0 fL   MCH 19.6 (L) 26.0 - 34.0 pg   MCHC 31.5 30.0 - 36.0 g/dL   RDW 14.7 (H) 82.9 - 56.2 %   Platelets 147 (L) 150 - 400 K/uL   Neutrophils Relative % 77 43 - 77 %   Lymphocytes Relative 12 12 - 46 %   Monocytes Relative 11 3 - 12 %   Eosinophils Relative 0 0 - 5 %   Basophils Relative 0 0 - 1 %   Neutro Abs 7.4 1.7 - 7.7 K/uL   Lymphs Abs 1.1 0.7 - 4.0 K/uL   Monocytes Absolute 1.0 0.1 - 1.0 K/uL   Eosinophils Absolute 0.0 0.0 - 0.7 K/uL   Basophils Absolute 0.0 0.0 - 0.1 K/uL   RBC Morphology POLYCHROMASIA PRESENT    Smear Review PLATELET COUNT CONFIRMED BY SMEAR   Comprehensive metabolic panel  Result Value Ref Range   Sodium 131 (L) 135 - 145 mmol/L   Potassium 3.3 (L) 3.5 - 5.1 mmol/L   Chloride 101 96 - 112 mEq/L   CO2 24 19 - 32 mmol/L   Glucose, Bld 100 (H) 70 - 99 mg/dL    BUN 7 6 - 23 mg/dL   Creatinine, Ser 1.30 (L) 0.50 - 1.10 mg/dL   Calcium 86.5 8.4 - 78.4 mg/dL   Total Protein 7.8 6.0 - 8.3 g/dL   Albumin 4.7 3.5 - 5.2 g/dL   AST 24 0 - 37 U/L   ALT 16 0 - 35 U/L   Alkaline Phosphatase 80 39 - 117 U/L   Total Bilirubin 4.1 (H) 0.3 - 1.2 mg/dL   GFR calc non Af Amer >90 >90 mL/min  GFR calc Af Amer >90 >90 mL/min   Anion gap 6 5 - 15  Reticulocytes  Result Value Ref Range   Retic Ct Pct 7.5 (H) 0.4 - 3.1 %   RBC. 4.33 3.87 - 5.11 MIL/uL   Retic Count, Manual 324.8 (H) 19.0 - 186.0 K/uL  Lactic acid, plasma  Result Value Ref Range   Lactic Acid, Venous 1.0 0.5 - 2.2 mmol/L   Dg Chest 2 View  11/07/2014   CLINICAL DATA:  Fever.  Pain, sickle cell pain for 3 days.  EXAM: CHEST  2 VIEW  COMPARISON:  03/05/2013  FINDINGS: Cardiomediastinal contours are normal, the heart is normal in size. Pulmonary vasculature is normal. There are ill-defined linear opacities in the right middle and left upper lobes. No pleural effusion or pneumothorax. No acute osseous abnormality is seen.  IMPRESSION: Ill-defined linear opacities in the middle and left upper lobes. This may reflect atelectasis versus early pneumonia.   Electronically Signed   By: Rubye Oaks M.D.   On: 11/07/2014 20:42       MDM   Iv ns bolus. Dilaudid 2 mg iv. Cultures.   Reviewed nursing notes and prior charts for additional history.   Pt notes pain persists. Dilaudid 2 mg iv.  Recheck pt appears more comfortable.  Given fever and ?pna on cxr, and recent admission, will rx for possible hcap.  Zosyn iv. Allergy to vanc, will give clindamycin instead.  Hospitalists called for admission.      Suzi Roots, MD 11/07/14 2105

## 2014-11-07 NOTE — ED Notes (Signed)
Pt states she cannot give urine sample at this time.

## 2014-11-08 DIAGNOSIS — D5700 Hb-SS disease with crisis, unspecified: Principal | ICD-10-CM

## 2014-11-08 LAB — URINE CULTURE
CULTURE: NO GROWTH
Colony Count: NO GROWTH

## 2014-11-08 MED ORDER — ONDANSETRON HCL 4 MG/2ML IJ SOLN: 4.0000 mg | Freq: Four times a day (QID) | INTRAMUSCULAR | Status: DC | PRN

## 2014-11-08 MED ORDER — NALOXONE HCL 0.4 MG/ML IJ SOLN: 0.4000 mg | INTRAMUSCULAR | Status: DC | PRN

## 2014-11-08 MED ORDER — ALBUTEROL SULFATE (2.5 MG/3ML) 0.083% IN NEBU: 2.5000 mg | INHALATION_SOLUTION | Freq: Four times a day (QID) | RESPIRATORY_TRACT | Status: DC | PRN

## 2014-11-08 MED ORDER — VITAMINS A & D EX OINT
TOPICAL_OINTMENT | CUTANEOUS | Status: AC
  Administered 2014-11-08: 1
  Filled 2014-11-08: qty 5

## 2014-11-08 MED ORDER — SODIUM CHLORIDE 0.9 % IV SOLN
12.5000 mg | Freq: Four times a day (QID) | INTRAVENOUS | Status: DC | PRN
  Filled 2014-11-08: qty 0.25

## 2014-11-08 MED ORDER — DIPHENHYDRAMINE HCL 12.5 MG/5ML PO ELIX
12.5000 mg | ORAL_SOLUTION | Freq: Four times a day (QID) | ORAL | Status: DC | PRN
  Administered 2014-11-08 – 2014-11-11 (×2): 12.5 mg via ORAL
  Filled 2014-11-08 (×2): qty 5

## 2014-11-08 MED ORDER — HYDROMORPHONE 2 MG/ML HIGH CONCENTRATION IV PCA SOLN
INTRAVENOUS | Status: DC
  Administered 2014-11-08: 2.8 mg via INTRAVENOUS
  Administered 2014-11-08: 15:00:00 via INTRAVENOUS
  Administered 2014-11-08: 10.8 mg via INTRAVENOUS
  Administered 2014-11-09: 9.5 mg via INTRAVENOUS
  Administered 2014-11-09 (×2): 6.5 mg via INTRAVENOUS
  Administered 2014-11-09: 4.5 mg via INTRAVENOUS
  Administered 2014-11-09: 3.5 mg via INTRAVENOUS
  Administered 2014-11-10: 4 mg via INTRAVENOUS
  Administered 2014-11-10: 5 mg via INTRAVENOUS
  Administered 2014-11-10: 5.5 mg via INTRAVENOUS
  Administered 2014-11-10: 9 mg via INTRAVENOUS
  Administered 2014-11-10: 3 mg via INTRAVENOUS
  Administered 2014-11-10: 4.5 mg via INTRAVENOUS
  Administered 2014-11-10: 6 mg via INTRAVENOUS
  Administered 2014-11-11: 2.5 mg via INTRAVENOUS
  Administered 2014-11-11: 9 mg via INTRAVENOUS
  Administered 2014-11-11: 08:00:00 via INTRAVENOUS
  Filled 2014-11-08 (×3): qty 25

## 2014-11-08 MED ORDER — SODIUM CHLORIDE 0.9 % IJ SOLN: 9.0000 mL | INTRAMUSCULAR | Status: DC | PRN

## 2014-11-08 NOTE — Progress Notes (Signed)
Subjective: A 19 year old female with sickle cell disease admitted with sickle cell painful crisis and possible healthcare associated pneumonia. Patient had a fever on admission with some findings on chest x-ray. Clinically however she denied any cough no shortness of breath. No nausea or vomiting. She had a pneumonia vaccine last week. On Dilaudid PCA low dose and is not getting adequate relief. Has used 14.75 mg in the last 24 hours.  Objective: Vital signs in last 24 hours: Temp:  [98.3 F (36.8 C)-101.7 F (38.7 C)] 98.3 F (36.8 C) (01/07 0943) Pulse Rate:  [89-120] 93 (01/07 0943) Resp:  [15-18] 15 (01/07 0943) BP: (127-157)/(73-90) 127/73 mmHg (01/07 0943) SpO2:  [98 %-100 %] 100 % (01/07 0943) Weight:  [83.6 kg (184 lb 4.9 oz)] 83.6 kg (184 lb 4.9 oz) (01/06 2332) Weight change:  Last BM Date: 11/04/14  Intake/Output from previous day: 01/06 0701 - 01/07 0700 In: 1238.3 [P.O.:350; I.V.:888.3] Out: 900 [Urine:900] Intake/Output this shift: Total I/O In: 480 [P.O.:480] Out: 1650 [Urine:1650]  General appearance: alert, cooperative and no distress Eyes: conjunctivae/corneas clear. PERRL, EOM's intact. Fundi benign. Neck: no adenopathy, no carotid bruit, no JVD, supple, symmetrical, trachea midline and thyroid not enlarged, symmetric, no tenderness/mass/nodules Back: symmetric, no curvature. ROM normal. No CVA tenderness. Resp: clear to auscultation bilaterally Chest wall: no tenderness Cardio: regular rate and rhythm, S1, S2 normal, no murmur, click, rub or gallop GI: soft, non-tender; bowel sounds normal; no masses,  no organomegaly Extremities: extremities normal, atraumatic, no cyanosis or edema Pulses: 2+ and symmetric Skin: Skin color, texture, turgor normal. No rashes or lesions Neurologic: Grossly normal  Lab Results:  Recent Labs  11/06/14 1107 11/07/14 2012  WBC 9.3 9.5  HGB 8.8* 8.5*  HCT 27.9* 27.0*  PLT 193 147*   BMET  Recent Labs  11/06/14 1107  11/07/14 2012  NA 136 131*  K 3.7 3.3*  CL 106 101  CO2 22 24  GLUCOSE 105* 100*  BUN 7 7  CREATININE 0.52 0.49*  CALCIUM 10.5 10.1    Studies/Results: Dg Chest 2 View  11/07/2014   CLINICAL DATA:  Fever.  Pain, sickle cell pain for 3 days.  EXAM: CHEST  2 VIEW  COMPARISON:  03/05/2013  FINDINGS: Cardiomediastinal contours are normal, the heart is normal in size. Pulmonary vasculature is normal. There are ill-defined linear opacities in the right middle and left upper lobes. No pleural effusion or pneumothorax. No acute osseous abnormality is seen.  IMPRESSION: Ill-defined linear opacities in the middle and left upper lobes. This may reflect atelectasis versus early pneumonia.   Electronically Signed   By: Rubye OaksMelanie  Ehinger M.D.   On: 11/07/2014 20:42    Medications: I have reviewed the patient's current medications.  Assessment/Plan: A [age] yo admitted with Sickle Cell Disease.  #1 Sickle Cell Painful Crisis: Patient not getting good control. Will change from low dose to high dose Dilaudid PCA and monitor. Continue Ibuprofen.  #2 HCAP: On Zosyn and Clindamycin. If no fever in next 24 hours, will Dc antibiotics. Doubt pneumonia.  #3 Hypokalemia: Replete  #4 Hyponatremia: Hydrate with saline.   LOS: 1 day   Seva Chancy,LAWAL 11/08/2014, 11:46 AM

## 2014-11-09 LAB — BASIC METABOLIC PANEL
Anion gap: 8 (ref 5–15)
BUN: 5 mg/dL — ABNORMAL LOW (ref 6–23)
CHLORIDE: 99 meq/L (ref 96–112)
CO2: 27 mmol/L (ref 19–32)
Calcium: 10 mg/dL (ref 8.4–10.5)
Creatinine, Ser: 0.43 mg/dL — ABNORMAL LOW (ref 0.50–1.10)
GFR calc Af Amer: >90 mL/min (ref >90)
GFR calc non Af Amer: >90 mL/min (ref >90)
GLUCOSE: 120 mg/dL — AB (ref 70–99)
Potassium: 3.6 mmol/L (ref 3.5–5.1)
Sodium: 134 mmol/L — ABNORMAL LOW (ref 135–145)

## 2014-11-09 NOTE — Progress Notes (Signed)
Subjective: Patient is doing much better today. She is now on high dose Dilaudid PCA. Her pain is now is 7 out of 10. No shortness of breath no cough. No fever no nausea vomiting or diarrhea. She has been on clindamycin for presumed pneumonia. This no clinically evidence of pneumonia at this point.  Objective: Vital signs in last 24 hours: Temp:  [99 F (37.2 C)-100 F (37.8 C)] 99.3 F (37.4 C) (01/08 0605) Pulse Rate:  [90-105] 90 (01/08 0605) Resp:  [11-20] 15 (01/08 0840) BP: (126-147)/(68-72) 137/72 mmHg (01/08 0605) SpO2:  [96 %-100 %] 98 % (01/08 0840) Weight:  [82.555 kg (182 lb)] 82.555 kg (182 lb) (01/08 0605) Weight change: -1.045 kg (-2 lb 4.9 oz) Last BM Date: 11/04/14  Intake/Output from previous day: 01/07 0701 - 01/08 0700 In: 3480 [P.O.:1080; I.V.:2400] Out: 6000 [Urine:6000] Intake/Output this shift:    General appearance: alert, cooperative and no distress Eyes: conjunctivae/corneas clear. PERRL, EOM's intact. Fundi benign. Neck: no adenopathy, no carotid bruit, no JVD, supple, symmetrical, trachea midline and thyroid not enlarged, symmetric, no tenderness/mass/nodules Back: symmetric, no curvature. ROM normal. No CVA tenderness. Resp: clear to auscultation bilaterally Chest wall: no tenderness Cardio: regular rate and rhythm, S1, S2 normal, no murmur, click, rub or gallop GI: soft, non-tender; bowel sounds normal; no masses,  no organomegaly Extremities: extremities normal, atraumatic, no cyanosis or edema Pulses: 2+ and symmetric Skin: Skin color, texture, turgor normal. No rashes or lesions Neurologic: Grossly normal  Lab Results:  Recent Labs  11/06/14 1107 11/07/14 2012  WBC 9.3 9.5  HGB 8.8* 8.5*  HCT 27.9* 27.0*  PLT 193 147*   BMET  Recent Labs  11/06/14 1107 11/07/14 2012  NA 136 131*  K 3.7 3.3*  CL 106 101  CO2 22 24  GLUCOSE 105* 100*  BUN 7 7  CREATININE 0.52 0.49*  CALCIUM 10.5 10.1    Studies/Results: Dg Chest 2  View  11/07/2014   CLINICAL DATA:  Fever.  Pain, sickle cell pain for 3 days.  EXAM: CHEST  2 VIEW  COMPARISON:  03/05/2013  FINDINGS: Cardiomediastinal contours are normal, the heart is normal in size. Pulmonary vasculature is normal. There are ill-defined linear opacities in the right middle and left upper lobes. No pleural effusion or pneumothorax. No acute osseous abnormality is seen.  IMPRESSION: Ill-defined linear opacities in the middle and left upper lobes. This may reflect atelectasis versus early pneumonia.   Electronically Signed   By: Rubye OaksMelanie  Ehinger M.D.   On: 11/07/2014 20:42    Medications: I have reviewed the patient's current medications.  Assessment/Plan: A 19 yo admitted with Sickle Cell Disease.  #1 Sickle Cell Painful Crisis: Patient is responding to the high-dose Dilaudid PCA. I will keep her on current dose for another 24 hours. Continue Ibuprofen.  #2 HCAP: Patient has been on clindamycin only. Since there is no clinical evidence of pneumonia I will discontinue the antibiotics.   #3 Hypokalemia: I will check a BMP this morning and if potassium is still low will Replete  #4 Hyponatremia: Follow H&H closely   LOS: 2 days   Tyshae Stair,LAWAL 11/09/2014, 10:04 AM

## 2014-11-10 LAB — COMPREHENSIVE METABOLIC PANEL
ALT: 16 U/L (ref 0–35)
ANION GAP: 7 (ref 5–15)
AST: 21 U/L (ref 0–37)
Albumin: 3.8 g/dL (ref 3.5–5.2)
Alkaline Phosphatase: 68 U/L (ref 39–117)
BILIRUBIN TOTAL: 3.4 mg/dL — AB (ref 0.3–1.2)
BUN: 7 mg/dL (ref 6–23)
CO2: 28 mmol/L (ref 19–32)
Calcium: 10.2 mg/dL (ref 8.4–10.5)
Chloride: 101 mEq/L (ref 96–112)
Creatinine, Ser: 0.43 mg/dL — ABNORMAL LOW (ref 0.50–1.10)
GFR calc Af Amer: >90 mL/min (ref >90)
GFR calc non Af Amer: >90 mL/min (ref >90)
Glucose, Bld: 109 mg/dL — ABNORMAL HIGH (ref 70–99)
Potassium: 4.1 mmol/L (ref 3.5–5.1)
SODIUM: 136 mmol/L (ref 135–145)
TOTAL PROTEIN: 7 g/dL (ref 6.0–8.3)

## 2014-11-10 LAB — CBC WITH DIFFERENTIAL/PLATELET
Basophils Absolute: 0 10*3/uL (ref 0.0–0.1)
Basophils Relative: 0 % (ref 0–1)
EOS PCT: 1 % (ref 0–5)
Eosinophils Absolute: 0.1 10*3/uL (ref 0.0–0.7)
HCT: 22.6 % — ABNORMAL LOW (ref 36.0–46.0)
Hemoglobin: 7.3 g/dL — ABNORMAL LOW (ref 12.0–15.0)
LYMPHS ABS: 1.3 10*3/uL (ref 0.7–4.0)
Lymphocytes Relative: 20 % (ref 12–46)
MCH: 19.8 pg — ABNORMAL LOW (ref 26.0–34.0)
MCHC: 32.3 g/dL (ref 30.0–36.0)
MCV: 61.2 fL — ABNORMAL LOW (ref 78.0–100.0)
MONO ABS: 0.7 10*3/uL (ref 0.1–1.0)
Monocytes Relative: 10 % (ref 3–12)
Neutro Abs: 4.5 10*3/uL (ref 1.7–7.7)
Neutrophils Relative %: 69 % (ref 43–77)
Platelets: 210 10*3/uL (ref 150–400)
RBC: 3.69 MIL/uL — ABNORMAL LOW (ref 3.87–5.11)
RDW: 20.4 % — AB (ref 11.5–15.5)
WBC: 6.6 10*3/uL (ref 4.0–10.5)

## 2014-11-10 NOTE — Plan of Care (Signed)
Problem: Phase I Progression Outcomes Goal: Bowel Movement At Least Every 3 Days Outcome: Not Progressing Pt has been taking sennacot BID, with no results at this time.

## 2014-11-10 NOTE — Progress Notes (Signed)
Subjective: Patient is doing better today. [place] is down to 6/10 but has swelling of her right elbow area where her pain is localized now. No fever, no NVD. She has used 12.55 mg of Dilaudid in 24 hours with 26 demands and 25 deliveries. No Cough or SOB. Objective: Vital signs in last 24 hours: Temp:  [98.1 F (36.7 C)-99.9 F (37.7 C)] 98.1 F (36.7 C) (01/09 1100) Pulse Rate:  [83-100] 83 (01/09 1100) Resp:  [12-26] 20 (01/09 1200) BP: (120-152)/(62-74) 134/72 mmHg (01/09 1100) SpO2:  [98 %-100 %] 99 % (01/09 1200) FiO2 (%):  [21 %] 21 % (01/09 1200) Weight:  [83.598 kg (184 lb 4.8 oz)] 83.598 kg (184 lb 4.8 oz) (01/09 0702) Weight change:  Last BM Date: 11/04/14  Intake/Output from previous day: 01/08 0701 - 01/09 0700 In: 2760 [P.O.:360; I.V.:2400] Out: 4800 [Urine:4800] Intake/Output this shift: Total I/O In: 240 [P.O.:240] Out: 1500 [Urine:1500]  General appearance: alert, cooperative and no distress Eyes: conjunctivae/corneas clear. PERRL, EOM's intact. Fundi benign. Neck: no adenopathy, no carotid bruit, no JVD, supple, symmetrical, trachea midline and thyroid not enlarged, symmetric, no tenderness/mass/nodules Back: symmetric, no curvature. ROM normal. No CVA tenderness. Resp: clear to auscultation bilaterally Chest wall: no tenderness Cardio: regular rate and rhythm, S1, S2 normal, no murmur, click, rub or gallop GI: soft, non-tender; bowel sounds normal; no masses,  no organomegaly Extremities: extremities normal, atraumatic, no cyanosis or edema Pulses: 2+ and symmetric Skin: Skin color, texture, turgor normal. No rashes or lesions Neurologic: Grossly normal  Lab Results:  Recent Labs  11/07/14 2012 11/10/14 0425  WBC 9.5 6.6  HGB 8.5* 7.3*  HCT 27.0* 22.6*  PLT 147* 210   BMET  Recent Labs  11/09/14 1108 11/10/14 0425  NA 134* 136  K 3.6 4.1  CL 99 101  CO2 27 28  GLUCOSE 120* 109*  BUN 5* 7  CREATININE 0.43* 0.43*  CALCIUM 10.0 10.2     Studies/Results: No results found.  Medications: I have reviewed the patient's current medications.  Assessment/Plan: A 19 yo admitted with Sickle Cell Disease.  #1 Sickle Cell Painful Crisis:We will continue high-dose Dilaudid PCA for another 24 hours. Continue Ibuprofen. Trial of heating pad on the right elbow.  #2 HCAP: No evidence of pneumonia so antibiotics were discontinued.  #3 Hypokalemia: Corrected. Continue monitoring  #4 Hyponatremia: Resolved   LOS: 3 days   Carry Ortez,LAWAL 11/10/2014, 12:48 PM

## 2014-11-11 NOTE — Discharge Summary (Addendum)
Physician Discharge Summary  Patient ID: Ana MansonMusu M Parpard-Jacke MRN: [id number] DOB/AGE: [birth year] 19 y.o.  Admit date: 11/07/2014 Discharge date: 11/11/2014  Admission Diagnoses:  Discharge Diagnoses:  Principal Problem:   Sickle cell crisis Active Problems:   Hypokalemia   HCAP (healthcare-associated pneumonia)   Fever   Hypertension   Discharged Condition: good  Hospital Course: Patient admitted with sickle cell painful crisis and fever as well as weakness. She was initially on Clindamycin and Zosyn which were discontinued since there was no active pneumonia detected. Patient was treated with IV Dilaudid PCA as well as oral medications. Her pain has gradually decreased from 9/10 to 3/10. She was discharged home to follow up with her PCP.  Consults: None  Significant Diagnostic Studies: labs: CBCs, CMPs and CXR done. Some abnormality on CXR but seems chronic.  Treatments: IV hydration, antibiotics: Zosyn and Clindamycin and analgesia: Dilaudid  Discharge Exam: Blood pressure 132/61, pulse 90, temperature 100 F (37.8 C), temperature source Oral, resp. rate 14, height 5\' 9"  (1.753 m), weight 83.915 kg (185 lb), last menstrual period 11/01/2014, SpO2 100 %. General appearance: alert, cooperative and no distress Eyes: conjunctivae/corneas clear. PERRL, EOM's intact. Fundi benign. Ears: normal TM's and external ear canals both ears Neck: no adenopathy, no carotid bruit, no JVD, supple, symmetrical, trachea midline and thyroid not enlarged, symmetric, no tenderness/mass/nodules Back: symmetric, no curvature. ROM normal. No CVA tenderness. Resp: clear to auscultation bilaterally Chest wall: no tenderness Cardio: regular rate and rhythm, S1, S2 normal, no murmur, click, rub or gallop GI: soft, non-tender; bowel sounds normal; no masses,  no organomegaly Extremities: extremities normal, atraumatic, no cyanosis or edema Pulses: 2+ and symmetric Skin: Skin color, texture, turgor  normal. No rashes or lesions Neurologic: Grossly normal  Disposition: 01-Home or Self Care     Medication List    TAKE these medications        aspirin EC 81 MG tablet  Take 81 mg by mouth daily.     ergocalciferol 50000 UNITS capsule  Commonly known as:  DRISDOL  Take 1 capsule (50,000 Units total) by mouth once a week.     folic acid 1 MG tablet  Commonly known as:  FOLVITE  Take 1 tablet (1 mg total) by mouth daily.     hydroxyurea 500 MG capsule  Commonly known as:  HYDREA  Take 4 capsules (2,000 mg total) by mouth daily. May take with food to minimize GI side effects.     loratadine 10 MG dissolvable tablet  Commonly known as:  CLARITIN REDITABS  Take 10 mg by mouth daily as needed for allergies.     NEXPLANON 68 MG Impl implant  Generic drug:  etonogestrel  1 each by Subdermal route once.     Oxycodone HCl 10 MG Tabs  Take 1 tablet (10 mg total) by mouth every 4 (four) hours as needed.         SignedLonia Blood: GARBA,LAWAL 11/11/2014, 10:47 AM  Time spent 32 minutes

## 2014-11-11 NOTE — Progress Notes (Signed)
Patient discharged home, all discharge medications and instructions reviewed and questions answered.  Patient does not want wheelchair assistance to vehicle, states prefers to ambulate.

## 2014-11-11 NOTE — Progress Notes (Signed)
Found pt in bathroom sitting on sink. I explained to sitting on the sink was a safety hazard. Pt replied she understood. She also stated, "I need a mirror so I can see my hair. There's no mirror outside." Will continue to monitor.

## 2014-11-11 NOTE — Progress Notes (Signed)
Wasted 24 ml of high concentrations dilaudid pca into sink with Gwenyth Bouillonindy Hughey, RN as witness.

## 2014-11-14 LAB — CULTURE, BLOOD (ROUTINE X 2)
CULTURE: NO GROWTH
CULTURE: NO GROWTH

## 2015-01-31 ENCOUNTER — Ambulatory Visit: Payer: Medicaid Other | Admitting: Family Medicine

## 2015-02-01 ENCOUNTER — Ambulatory Visit: Payer: Medicaid Other | Admitting: Family Medicine

## 2015-02-19 ENCOUNTER — Ambulatory Visit: Payer: Medicaid Other | Admitting: Family Medicine

## 2015-03-04 ENCOUNTER — Ambulatory Visit (INDEPENDENT_AMBULATORY_CARE_PROVIDER_SITE_OTHER): Payer: Medicaid Other | Admitting: Family Medicine

## 2015-03-04 ENCOUNTER — Encounter: Payer: Self-pay | Admitting: Family Medicine

## 2015-03-04 VITALS — BP 131/70 | HR 70 | Temp 98.2°F | Resp 16 | Ht 69.0 in | Wt 192.0 lb

## 2015-03-04 DIAGNOSIS — J302 Other seasonal allergic rhinitis: Secondary | ICD-10-CM | POA: Diagnosis not present

## 2015-03-04 DIAGNOSIS — D571 Sickle-cell disease without crisis: Secondary | ICD-10-CM | POA: Diagnosis not present

## 2015-03-04 LAB — COMPLETE METABOLIC PANEL WITH GFR
ALK PHOS: 57 U/L (ref 39–117)
ALT: 11 U/L (ref 0–35)
AST: 19 U/L (ref 0–37)
Albumin: 4.4 g/dL (ref 3.5–5.2)
BUN: 8 mg/dL (ref 6–23)
CALCIUM: 10.1 mg/dL (ref 8.4–10.5)
CO2: 23 mEq/L (ref 19–32)
Chloride: 108 mEq/L (ref 96–112)
Creat: 0.53 mg/dL (ref 0.50–1.10)
GFR, Est African American: >89 mL/min
GFR, Est Non African American: >89 mL/min
Glucose, Bld: 85 mg/dL (ref 70–99)
POTASSIUM: 3.6 meq/L (ref 3.5–5.3)
Sodium: 136 mEq/L (ref 135–145)
Total Bilirubin: 4.2 mg/dL — ABNORMAL HIGH (ref 0.2–1.2)
Total Protein: 7.1 g/dL (ref 6.0–8.3)

## 2015-03-04 LAB — CBC WITH DIFFERENTIAL/PLATELET
BASOS PCT: 0 % (ref 0–1)
Basophils Absolute: 0 10*3/uL (ref 0.0–0.1)
Eosinophils Absolute: 0.2 10*3/uL (ref 0.0–0.7)
Eosinophils Relative: 3 % (ref 0–5)
HCT: 26.8 % — ABNORMAL LOW (ref 36.0–46.0)
HEMOGLOBIN: 8.7 g/dL — AB (ref 12.0–15.0)
LYMPHS ABS: 1.5 10*3/uL (ref 0.7–4.0)
LYMPHS PCT: 24 % (ref 12–46)
MCH: 19.9 pg — ABNORMAL LOW (ref 26.0–34.0)
MCHC: 32.5 g/dL (ref 30.0–36.0)
MCV: 61.2 fL — AB (ref 78.0–100.0)
MONO ABS: 0.5 10*3/uL (ref 0.1–1.0)
Monocytes Relative: 8 % (ref 3–12)
NEUTROS PCT: 65 % (ref 43–77)
Neutro Abs: 4 10*3/uL (ref 1.7–7.7)
Platelets: 259 10*3/uL (ref 150–400)
RBC: 4.38 MIL/uL (ref 3.87–5.11)
RDW: 20 % — AB (ref 11.5–15.5)
WBC: 6.1 10*3/uL (ref 4.0–10.5)

## 2015-03-04 LAB — POCT URINALYSIS DIP (DEVICE)
BILIRUBIN URINE: NEGATIVE
GLUCOSE, UA: NEGATIVE mg/dL
HGB URINE DIPSTICK: NEGATIVE
Ketones, ur: NEGATIVE mg/dL
Nitrite: NEGATIVE
PROTEIN: NEGATIVE mg/dL
Specific Gravity, Urine: 1.015 (ref 1.005–1.030)
Urobilinogen, UA: 1 mg/dL (ref 0.0–1.0)
pH: 5.5 (ref 5.0–8.0)

## 2015-03-04 NOTE — Progress Notes (Signed)
Subjective:    Patient ID: Ana Brooks, female    DOB: Mar 05, 1996, 18 y.o.   MRN: [id number]  HPI Ana Brooks, patient with a history of sickle cell anemia, HbSS presents for 3 month follow up. Patient states that she feels well and is without complaint. She states that she has been consistently following Hydroxyurea and Folic acid regimen. Patient reports that she has not experienced a vasocclusive pain crisis since last January. She states that she is currently not taking opiate medications for pain. Ana Brooks denies headache, fatigue, shortness of breath, chest pain, nausea, vomiting, or diarrhea.  Past Medical History  Diagnosis Date  . Sickle cell anemia   . Allergy   . Diabetes mellitus without complication   . COPD (chronic obstructive pulmonary disease)   . Hypertension    History   Social History  . Marital Status: Single    Spouse Name: N/A  . Number of Children: N/A  . Years of Education: N/A   Occupational History  . Not on file.   Social History Main Topics  . Smoking status: Never Smoker   . Smokeless tobacco: Not on file  . Alcohol Use: No  . Drug Use: No  . Sexual Activity: Yes    Birth Control/ Protection: Condom   Other Topics Concern  . Not on file   Social History Narrative   Review of Systems  Constitutional: Negative.  Negative for fever and fatigue.  HENT: Negative.   Eyes: Negative.   Respiratory: Negative.   Cardiovascular: Negative.   Gastrointestinal: Negative.   Endocrine: Negative.   Genitourinary: Negative.   Musculoskeletal: Negative.   Skin: Negative.   Allergic/Immunologic: Negative.   Neurological: Negative.   Hematological: Negative.   Psychiatric/Behavioral: Negative.        Objective:   Physical Exam  Constitutional: She is oriented to person, place, and time. She appears well-developed and well-nourished.  HENT:  Head: Normocephalic and atraumatic.  Right Ear: External ear normal.  Left Ear:  External ear normal.  Mouth/Throat: Oropharynx is clear and moist.  Eyes: Conjunctivae and EOM are normal. Pupils are equal, round, and reactive to light.  Neck: Normal range of motion. Neck supple.  Cardiovascular: Normal rate, regular rhythm, normal heart sounds and intact distal pulses.   Pulmonary/Chest: Effort normal and breath sounds normal.  Abdominal: Soft. Bowel sounds are normal.  Musculoskeletal: Normal range of motion.  Neurological: She is alert and oriented to person, place, and time. She has normal reflexes.  Skin: Skin is warm and dry.  Psychiatric: She has a normal mood and affect. Her behavior is normal. Judgment and thought content normal.         BP 131/70 mmHg  Pulse 70  Temp(Src) 98.2 F (36.8 C) (Oral)  Resp 16  Ht 5\' 9"  (1.753 m)  Wt 192 lb (87.091 kg)  BMI 28.34 kg/m2  LMP 02/18/2015 Assessment & Plan:   1. Sickle cell disease, type SS Sickle cell disease - Continue Hydrea 2000 mg daily. Reviewed hemoglobin, hematocrit, platelet count, and ANC. Patient can safely continue Hydrea therapy. Patient will follow up in 3 months.  We discussed the need for good hydration, monitoring of hydration status, avoidance of heat, cold, stress, and infection triggers. We discussed the risks and benefits of Hydrea, including bone marrow suppression, the possibility of GI upset, skin ulcers, hair thinning, and teratogenicity. The patient was reminded of the need to seek medical attention of any symptoms of bleeding, anemia, or infection. Continue  foliwc acid 1 mg daily to prevent aplastic bone marrow crises. 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   Pulmonary evaluation - Patient denies severe recurrent wheezes, shortness of breath with exercise, or persistent cough. If these symptoms develop, pulmonary function tests with spirometry will be ordered, and if abnormal, plan on referral to Pulmonology for further evaluation.  Cardiac - Routine  screening for pulmonary hypertension is not recommended.  Eye - High risk of proliferative retinopathy. Annual eye exam last November 2015.   Immunization status - Immunizations up to date.     Acute and chronic painful episodes - Ms. Brooks is currently not taking opiate medications. She states that she has not had frequent pain episodes since starting hydrea consistently. Patient informed that she can take OTC Ibuprofen for mild to moderate pain. Discussed prescription policy at length. Patient expressed understanding.   Iron overload from chronic transfusion.    Vitamin D deficiency -  Reviewed previous vitamin D level. Will continue Drisdol 50,000 units weekly.    The above recommendations are taken from the NIH Evidence-Based Management of Sickle Cell Disease: Expert Panel Report, 04540.   - CBC with Differential - COMPLETE METABOLIC PANEL WITH GFR - Vitamin D, 25-hydroxy - POCT urinalysis dipstick   2. Seasonal allergies  - cetirizine (ZYRTEC) 10 MG tablet; Take 1 tablet (10 mg total) by mouth daily.  Dispense: 30 tablet; Refill: 11    Junie Engram M, FNP

## 2015-03-04 NOTE — Patient Instructions (Signed)
Sickle Cell Anemia, Adult °Sickle cell anemia is a condition in which red blood cells have an abnormal "sickle" shape. This abnormal shape shortens the cells' life span, which results in a lower than normal concentration of red blood cells in the blood. The sickle shape also causes the cells to clump together and block free blood flow through the blood vessels. As a result, the tissues and organs of the body do not receive enough oxygen. Sickle cell anemia causes organ damage and pain and increases the risk of infection. °CAUSES  °Sickle cell anemia is a genetic disorder. Those who receive two copies of the gene have the condition, and those who receive one copy have the trait. °RISK FACTORS °The sickle cell gene is most common in people whose families originated in [place]. Other areas of the globe where sickle cell trait occurs include the Mediterranean, [place] and [place], the [place], and the [place].  °SIGNS AND SYMPTOMS °· Pain, especially in the extremities, back, chest, or abdomen (common). The pain may start suddenly or may develop following an illness, especially if there is dehydration. Pain can also occur due to overexertion or exposure to extreme temperature changes. °· Frequent severe bacterial infections, especially certain types of pneumonia and meningitis. °· Pain and swelling in the hands and feet. °· Decreased activity.   °· Loss of appetite.   °· Change in behavior. °· Headaches. °· Seizures. °· Shortness of breath or difficulty breathing. °· Vision changes. °· Skin ulcers. °Those with the trait may not have symptoms or they may have mild symptoms.  °DIAGNOSIS  °Sickle cell anemia is diagnosed with blood tests that demonstrate the genetic trait. It is often diagnosed during the newborn period, due to mandatory testing nationwide. A variety of blood tests, X-rays, CT scans, MRI scans, ultrasounds, and lung function tests may also be done to monitor the condition. °TREATMENT  °Sickle  cell anemia may be treated with: °· Medicines. You may be given pain medicines, antibiotic medicines (to treat and prevent infections) or medicines to increase the production of certain types of hemoglobin. °· Fluids. °· Oxygen. °· Blood transfusions. °HOME CARE INSTRUCTIONS  °· Drink enough fluid to keep your urine clear or pale yellow. Increase your fluid intake in hot weather and during exercise. °· Do not smoke. Smoking lowers oxygen levels in the blood.   °· Only take over-the-counter or prescription medicines for pain, fever, or discomfort as directed by your health care provider. °· Take antibiotics as directed by your health care provider. Make sure you finish them it even if you start to feel better.   °· Take supplements as directed by your health care provider.   °· Consider wearing a medical alert bracelet. This tells anyone caring for you in an emergency of your condition.   °· When traveling, keep your medical information, health care provider's names, and the medicines you take with you at all times.   °· If you develop a fever, do not take medicines to reduce the fever right away. This could cover up a problem that is developing. Notify your health care provider. °· Keep all follow-up appointments with your health care provider. Sickle cell anemia requires regular medical care. °SEEK MEDICAL CARE IF: ° You have a fever. °SEEK IMMEDIATE MEDICAL CARE IF:  °· You feel dizzy or faint.   °· You have new abdominal pain, especially on the left side near the stomach area.   °· You develop a persistent, often uncomfortable and painful penile erection (priapism). If this is not treated immediately it   will lead to impotence.   °· You have numbness your arms or legs or you have a hard time moving them.   °· You have a hard time with speech.   °· You have a fever or persistent symptoms for more than 2-3 days.   °· You have a fever and your symptoms suddenly get worse.   °· You have signs or symptoms of infection.  These include:   °¨ Chills.   °¨ Abnormal tiredness (lethargy).   °¨ Irritability.   °¨ Poor eating.   °¨ Vomiting.   °· You develop pain that is not helped with medicine.   °· You develop shortness of breath. °· You have pain in your chest.   °· You are coughing up pus-like or bloody sputum.   °· You develop a stiff neck. °· Your feet or hands swell or have pain. °· Your abdomen appears bloated. °· You develop joint pain. °MAKE SURE YOU: °· Understand these instructions. °Document Released: 01/27/2006 Document Revised: 03/05/2014 Document Reviewed: 05/31/2013 °Exit[employer]® Patient Information ©2015 [employer]. This information is not intended to replace advice given to you by your health care provider. Make sure you discuss any questions you have with your health care provider. ° °

## 2015-03-05 ENCOUNTER — Encounter: Payer: Self-pay | Admitting: Family Medicine

## 2015-03-05 DIAGNOSIS — J302 Other seasonal allergic rhinitis: Secondary | ICD-10-CM | POA: Insufficient documentation

## 2015-03-05 LAB — VITAMIN D 25 HYDROXY (VIT D DEFICIENCY, FRACTURES): Vit D, 25-Hydroxy: 7 ng/mL — ABNORMAL LOW (ref 30–100)

## 2015-03-05 MED ORDER — CETIRIZINE HCL 10 MG PO TABS: 10.0000 mg | ORAL_TABLET | Freq: Every day | ORAL | Status: AC

## 2015-06-05 ENCOUNTER — Ambulatory Visit: Payer: Medicaid Other | Admitting: Family Medicine

## 2016-01-31 IMAGING — CR DG CHEST 2V
2 series · 2 of 2 positions shown · non-contrast
Comparison: 03/05/2013

CLINICAL DATA: Fever.  Pain, sickle cell pain for 3 days.

EXAM:
CHEST  2 VIEW

[w chest pa]
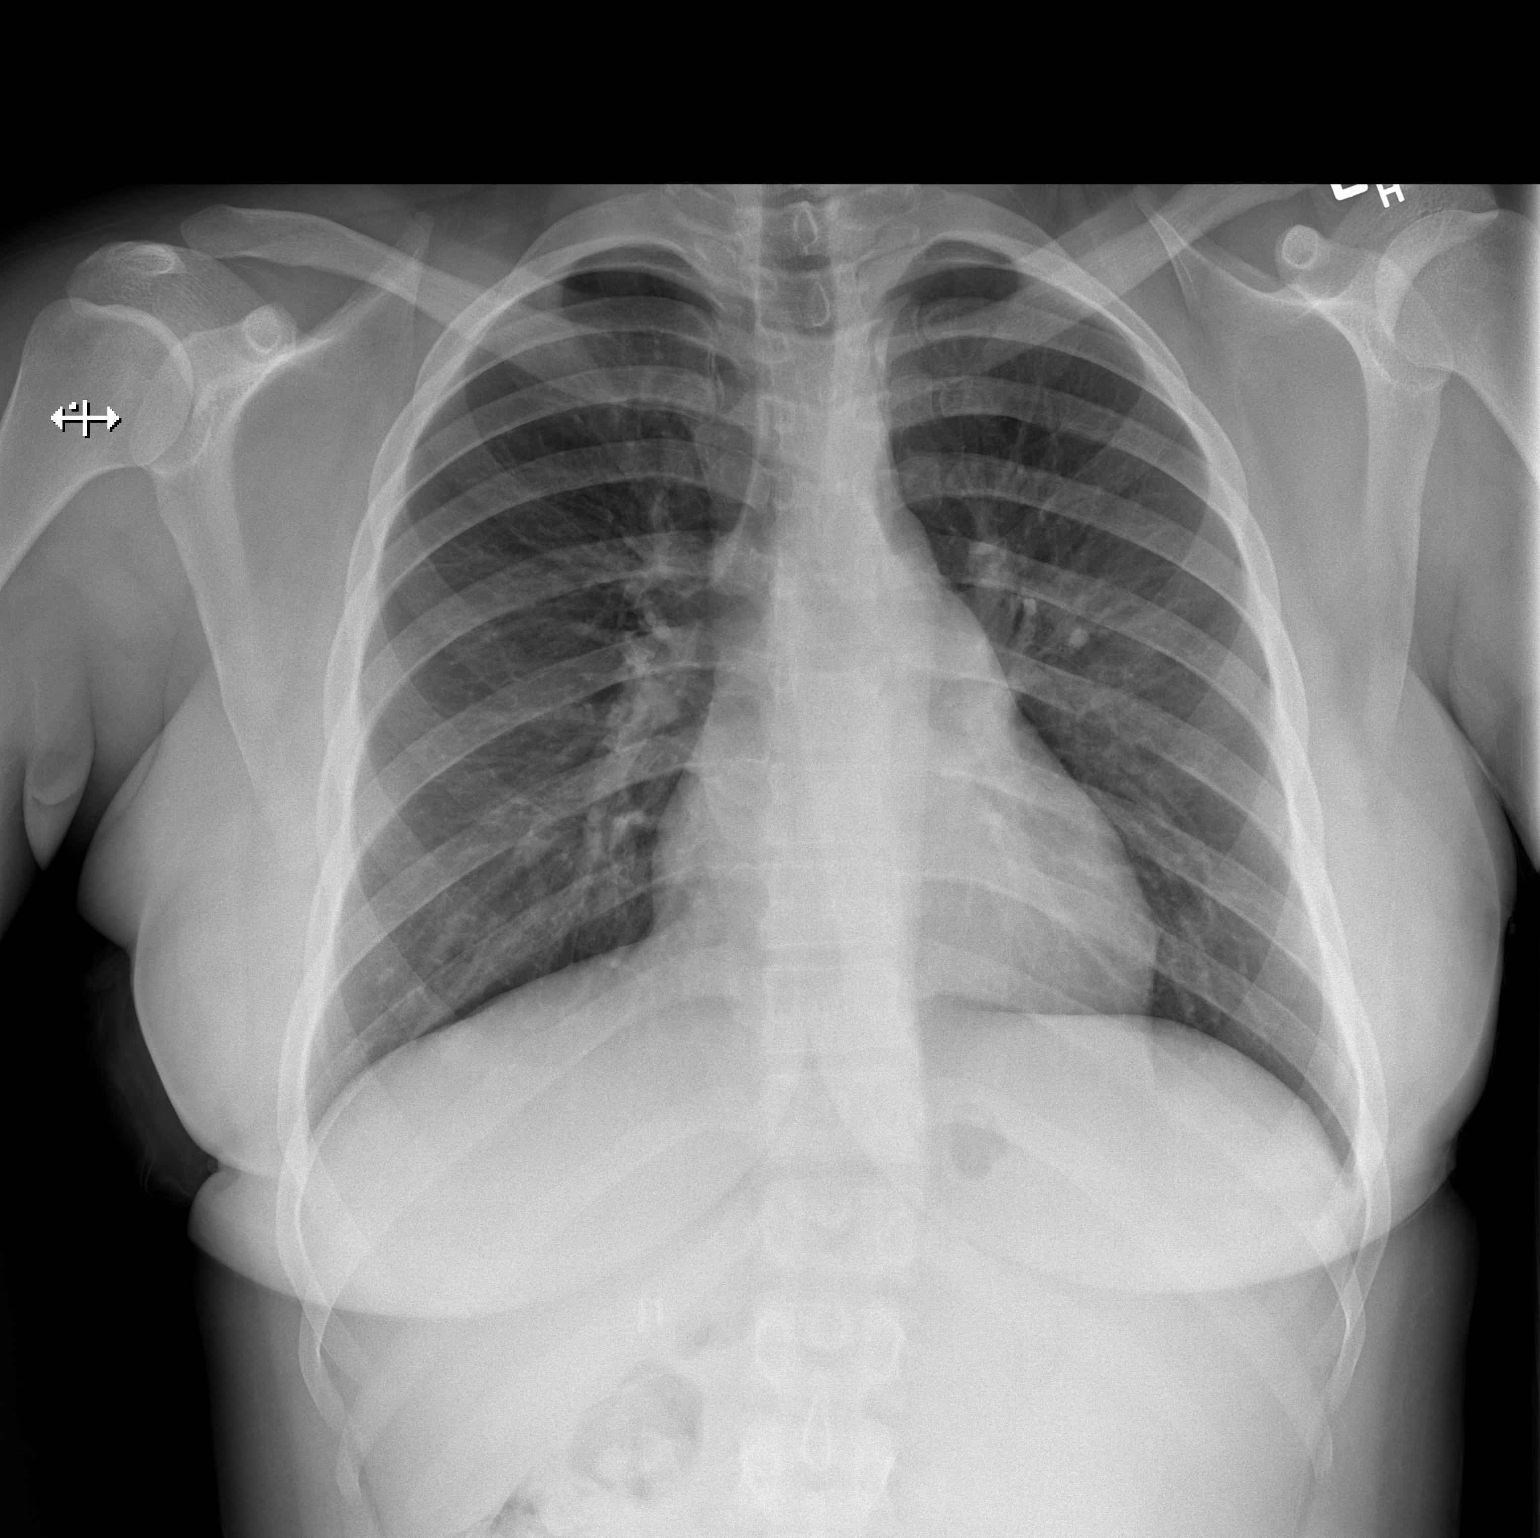

[w chest lat]
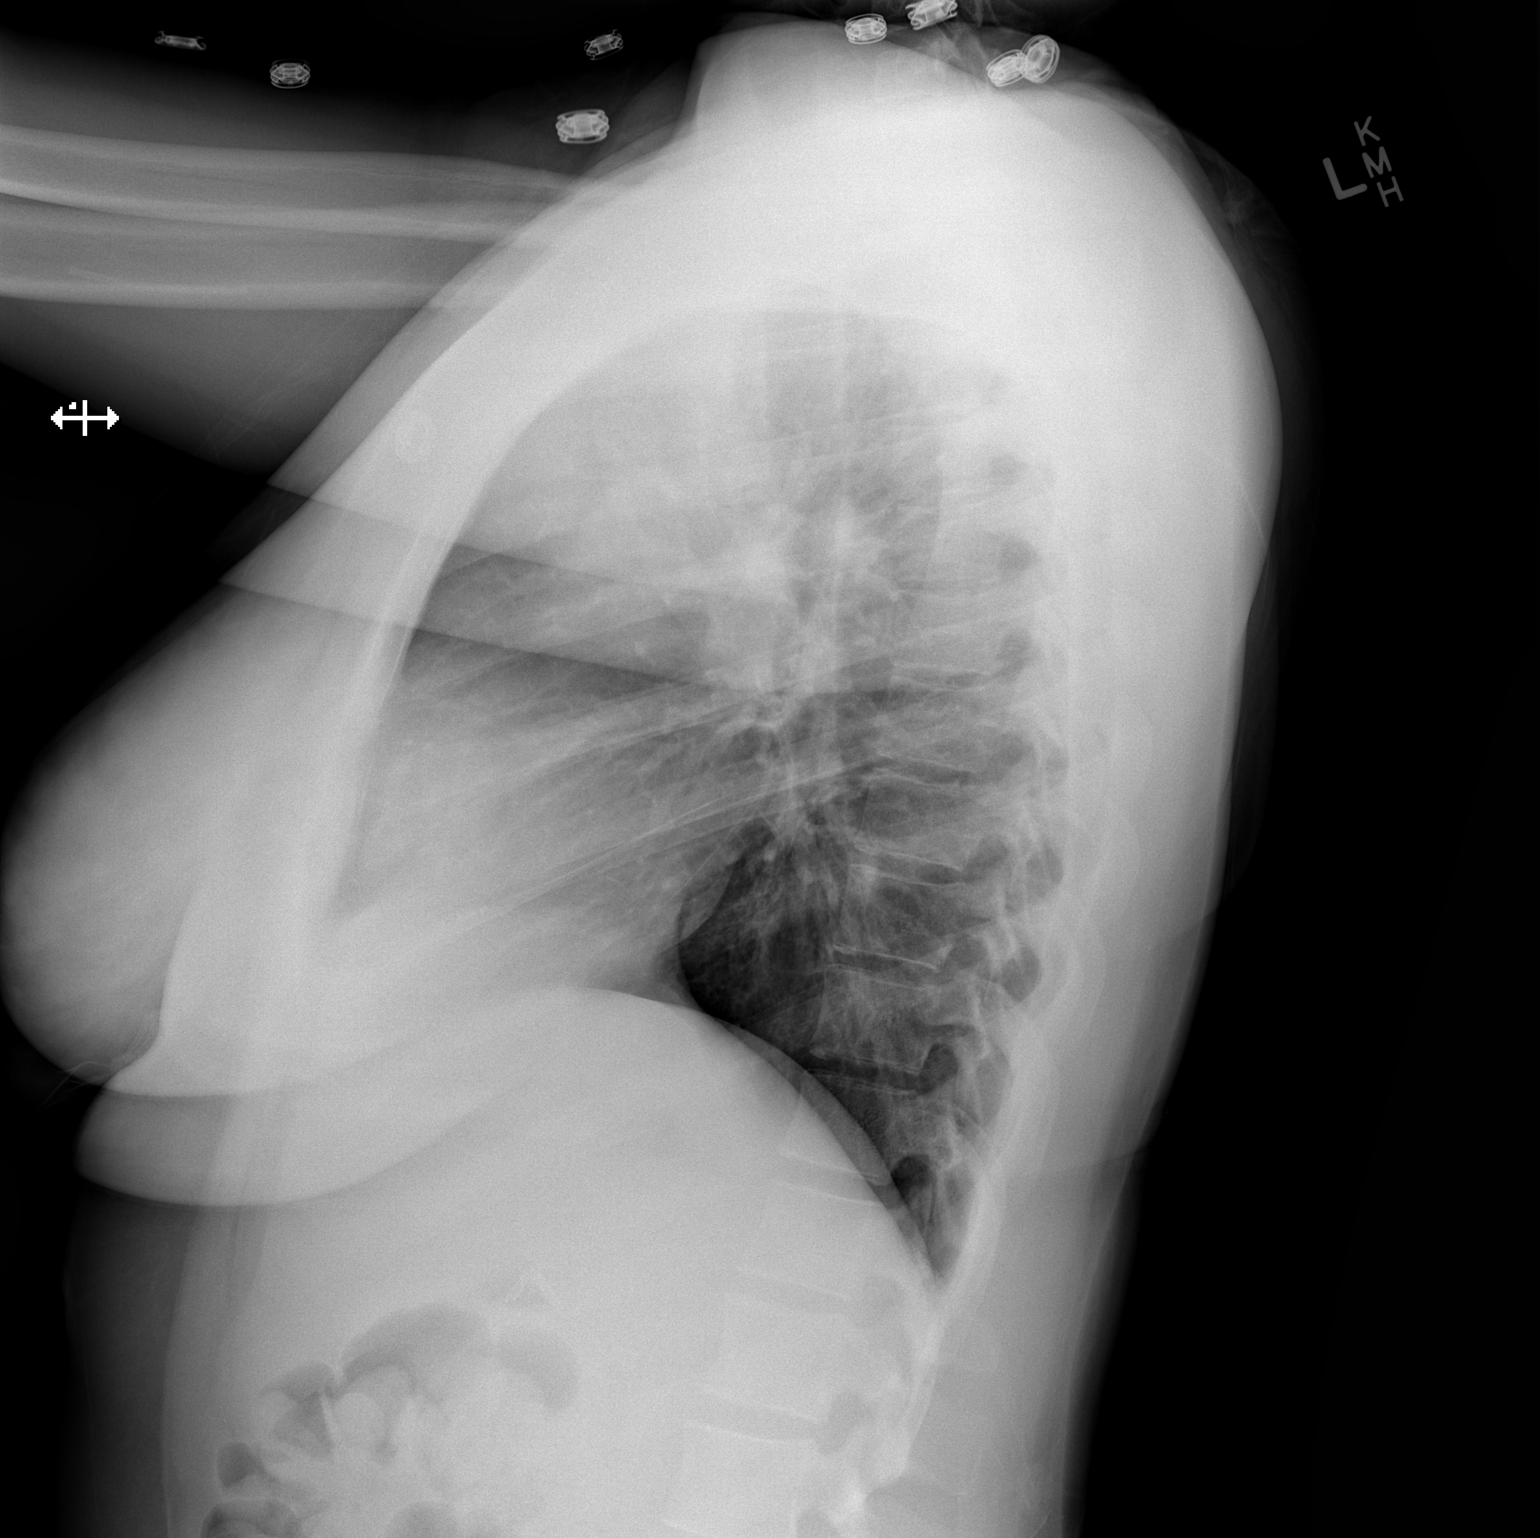

[2 of 2 positions shown; findings below may reference images not displayed]

FINDINGS: Cardiomediastinal contours are normal, the heart is normal in size.
Pulmonary vasculature is normal. There are ill-defined linear
opacities in the right middle and left upper lobes. No pleural
effusion or pneumothorax. No acute osseous abnormality is seen.
IMPRESSION: Ill-defined linear opacities in the middle and left upper lobes.
This may reflect atelectasis versus early pneumonia.
